# Patient Record
Sex: Male | Born: 1970
Health system: Southern US, Community
[De-identification: ages and names within clinical notes are randomized; demographics above are authoritative.]

## PROBLEM LIST (undated history)

## (undated) DIAGNOSIS — I10 Essential (primary) hypertension: Secondary | ICD-10-CM

## (undated) DIAGNOSIS — K219 Gastro-esophageal reflux disease without esophagitis: Secondary | ICD-10-CM

## (undated) DIAGNOSIS — G4730 Sleep apnea, unspecified: Secondary | ICD-10-CM

## (undated) HISTORY — DX: Essential (primary) hypertension: I10

## (undated) HISTORY — PX: HAND SURGERY: SHX662

---

## 1990-01-01 HISTORY — PX: HAND SURGERY: SHX662

## 1998-01-01 HISTORY — PX: COLONOSCOPY: SHX174

## 2007-12-17 ENCOUNTER — Emergency Department (HOSPITAL_COMMUNITY): Admission: EM | Admit: 2007-12-17 | Discharge: 2007-12-17 | Payer: Self-pay | Admitting: Family Medicine

## 2013-07-01 DIAGNOSIS — E291 Testicular hypofunction: Secondary | ICD-10-CM | POA: Insufficient documentation

## 2013-07-02 DIAGNOSIS — L821 Other seborrheic keratosis: Secondary | ICD-10-CM | POA: Insufficient documentation

## 2014-09-14 DIAGNOSIS — E66811 Obesity, class 1: Secondary | ICD-10-CM | POA: Insufficient documentation

## 2014-09-14 DIAGNOSIS — Z23 Encounter for immunization: Secondary | ICD-10-CM | POA: Insufficient documentation

## 2015-07-28 ENCOUNTER — Ambulatory Visit (INDEPENDENT_AMBULATORY_CARE_PROVIDER_SITE_OTHER): Payer: BLUE CROSS/BLUE SHIELD | Admitting: Podiatry

## 2015-07-28 ENCOUNTER — Encounter: Payer: Self-pay | Admitting: Podiatry

## 2015-07-28 DIAGNOSIS — M216X9 Other acquired deformities of unspecified foot: Secondary | ICD-10-CM | POA: Insufficient documentation

## 2015-07-28 DIAGNOSIS — M722 Plantar fascial fibromatosis: Secondary | ICD-10-CM

## 2015-07-28 NOTE — Patient Instructions (Signed)
Seen for bilateral heel pain. Reviewed findings. Need daily stretch exercise for tight Achilles tendon. May benefit from Custom orthotics. Night Splint dispensed x 1.

## 2015-07-28 NOTE — Progress Notes (Signed)
Heel pain 3-4 months especially in the morning L>R Just plantar anterior to the heel, espeially after exercise.  On feet at work about 11-12 hours/day off and on. Work 10 hour shift.  Nexium, SUBJECTIVE: 45 y.o. year old male presents complaining of bilateral heel pain x 3 -4 months especially in the morning and after getting up from sitting. Also hurts after exercise. He works 10 hour shift and on feet 11-12 hours a day.   REVIEW OF SYSTEMS: A comprehensive review of systems was negative except for: taking Nexium for heart burn.  OBJECTIVE: DERMATOLOGIC EXAMINATION: No abnormal skin lesions.   VASCULAR EXAMINATION OF LOWER LIMBS: Pedal pulses: All pedal pulses are palpable with normal pulsation.  Temperature gradient from tibial crest to dorsum of foot is within normal bilateral.  NEUROLOGIC EXAMINATION OF THE LOWER LIMBS: All epicritic and tactile sensations grossly intact.   MUSCULOSKELETAL EXAMINATION: Positive for hight arched cavus type foot, rearfoot varus, and tight Achilles tendon bilateral.   ASSESSMENT: 1. Plantar fasciitis bilateral. 2. Rearfoot varus, compensated. 3. Ankle equinus bilateral.  PLAN: Reviewed clinical findings and available treatment options. Reviewed stretch exercise for tight Achilles tendon bilateral. Night Splint dispensed x 1. Return for custom orthotics.

## 2015-08-11 ENCOUNTER — Encounter: Payer: Self-pay | Admitting: Podiatry

## 2015-08-11 ENCOUNTER — Ambulatory Visit (INDEPENDENT_AMBULATORY_CARE_PROVIDER_SITE_OTHER): Payer: BLUE CROSS/BLUE SHIELD | Admitting: Podiatry

## 2015-08-11 VITALS — BP 140/93 | HR 76

## 2015-08-11 DIAGNOSIS — M216X9 Other acquired deformities of unspecified foot: Secondary | ICD-10-CM | POA: Diagnosis not present

## 2015-08-11 DIAGNOSIS — M216X2 Other acquired deformities of left foot: Secondary | ICD-10-CM

## 2015-08-11 DIAGNOSIS — M722 Plantar fascial fibromatosis: Secondary | ICD-10-CM | POA: Diagnosis not present

## 2015-08-11 DIAGNOSIS — M216X1 Other acquired deformities of right foot: Secondary | ICD-10-CM

## 2015-08-11 DIAGNOSIS — M79673 Pain in unspecified foot: Secondary | ICD-10-CM | POA: Diagnosis not present

## 2015-08-11 NOTE — Patient Instructions (Signed)
Both feet casted for orthotics. 

## 2015-08-11 NOTE — Progress Notes (Signed)
SUBJECTIVE: 45 y.o. year old male presents to prepare for custom orthotics. He is using Night splint and doing stretch exercise. He believes they are helping.   History: Bilateral heel pain x 3 -4 months especially in the morning and after getting up from sitting.  Also hurts after exercise. He works 10 hour shift in [place] and on feet 11-12 hours a day.   REVIEW OF SYSTEMS: A comprehensive review of systems was negative except for: taking Nexium for heart burn.  OBJECTIVE: DERMATOLOGIC EXAMINATION: No abnormal skin lesions.  VASCULAR EXAMINATION OF LOWER LIMBS: Pedal pulses: All pedal pulses are palpable with normal pulsation.  Temperature gradient from tibial crest to dorsum of foot is within normal bilateral. NEUROLOGIC EXAMINATION OF THE LOWER LIMBS:  All epicritic and tactile sensations grossly intact.  MUSCULOSKELETAL EXAMINATION: Positive for hight arched cavus type foot, rearfoot varus, and tight Achilles tendon bilateral.   Radiographic examination reveal rectus foot, fibular sesamoid position at 4 bilateral, mild hallux interphalangeus bilateral. Lateral view reveal supinated foot, high arched cavus foot with elevated first ray and mild inferior calcaneal spur bilateral.   ASSESSMENT: 1. Plantar fasciitis bilateral. 2. Rearfoot varus, compensated. 3. Ankle equinus bilateral.  PLAN: Both feet casted for Orthotics.

## 2015-11-15 ENCOUNTER — Ambulatory Visit: Payer: BLUE CROSS/BLUE SHIELD | Admitting: Podiatry

## 2015-12-07 DIAGNOSIS — R5383 Other fatigue: Secondary | ICD-10-CM | POA: Insufficient documentation

## 2015-12-07 DIAGNOSIS — M25562 Pain in left knee: Secondary | ICD-10-CM | POA: Insufficient documentation

## 2015-12-07 DIAGNOSIS — E291 Testicular hypofunction: Secondary | ICD-10-CM | POA: Diagnosis not present

## 2015-12-07 DIAGNOSIS — G8929 Other chronic pain: Secondary | ICD-10-CM | POA: Diagnosis not present

## 2015-12-07 DIAGNOSIS — G4709 Other insomnia: Secondary | ICD-10-CM | POA: Diagnosis not present

## 2016-08-20 DIAGNOSIS — E291 Testicular hypofunction: Secondary | ICD-10-CM | POA: Diagnosis not present

## 2016-08-20 DIAGNOSIS — I10 Essential (primary) hypertension: Secondary | ICD-10-CM | POA: Diagnosis not present

## 2016-08-20 DIAGNOSIS — Z6833 Body mass index (BMI) 33.0-33.9, adult: Secondary | ICD-10-CM | POA: Diagnosis not present

## 2016-12-31 DIAGNOSIS — I10 Essential (primary) hypertension: Secondary | ICD-10-CM | POA: Diagnosis not present

## 2016-12-31 DIAGNOSIS — R0683 Snoring: Secondary | ICD-10-CM | POA: Diagnosis not present

## 2016-12-31 DIAGNOSIS — E669 Obesity, unspecified: Secondary | ICD-10-CM | POA: Diagnosis not present

## 2016-12-31 DIAGNOSIS — J0110 Acute frontal sinusitis, unspecified: Secondary | ICD-10-CM | POA: Diagnosis not present

## 2017-01-03 DIAGNOSIS — R0683 Snoring: Secondary | ICD-10-CM | POA: Diagnosis not present

## 2017-01-03 DIAGNOSIS — R5383 Other fatigue: Secondary | ICD-10-CM | POA: Diagnosis not present

## 2017-01-03 DIAGNOSIS — I10 Essential (primary) hypertension: Secondary | ICD-10-CM | POA: Diagnosis not present

## 2017-02-26 DIAGNOSIS — R29818 Other symptoms and signs involving the nervous system: Secondary | ICD-10-CM | POA: Insufficient documentation

## 2017-02-26 DIAGNOSIS — R0683 Snoring: Secondary | ICD-10-CM | POA: Diagnosis not present

## 2017-03-27 DIAGNOSIS — R400 Somnolence: Secondary | ICD-10-CM | POA: Diagnosis not present

## 2017-03-27 DIAGNOSIS — R0683 Snoring: Secondary | ICD-10-CM | POA: Diagnosis not present

## 2017-09-05 ENCOUNTER — Encounter: Payer: Self-pay | Admitting: Family Medicine

## 2017-09-05 ENCOUNTER — Ambulatory Visit: Payer: BLUE CROSS/BLUE SHIELD | Admitting: Family Medicine

## 2017-09-05 VITALS — BP 130/80 | HR 66 | Ht 72.0 in | Wt 236.4 lb

## 2017-09-05 DIAGNOSIS — Z7689 Persons encountering health services in other specified circumstances: Secondary | ICD-10-CM | POA: Insufficient documentation

## 2017-09-05 DIAGNOSIS — G4700 Insomnia, unspecified: Secondary | ICD-10-CM

## 2017-09-05 DIAGNOSIS — Z0000 Encounter for general adult medical examination without abnormal findings: Secondary | ICD-10-CM | POA: Insufficient documentation

## 2017-09-05 DIAGNOSIS — N529 Male erectile dysfunction, unspecified: Secondary | ICD-10-CM | POA: Diagnosis not present

## 2017-09-05 DIAGNOSIS — Z0001 Encounter for general adult medical examination with abnormal findings: Secondary | ICD-10-CM

## 2017-09-05 DIAGNOSIS — Z125 Encounter for screening for malignant neoplasm of prostate: Secondary | ICD-10-CM | POA: Insufficient documentation

## 2017-09-05 DIAGNOSIS — I10 Essential (primary) hypertension: Secondary | ICD-10-CM | POA: Diagnosis not present

## 2017-09-05 DIAGNOSIS — F1021 Alcohol dependence, in remission: Secondary | ICD-10-CM

## 2017-09-05 HISTORY — DX: Insomnia, unspecified: G47.00

## 2017-09-05 MED ORDER — LOSARTAN POTASSIUM-HCTZ 100-25 MG PO TABS: 1.0000 | ORAL_TABLET | Freq: Every day | ORAL | 1 refills | Status: DC

## 2017-09-05 MED ORDER — ZOLPIDEM TARTRATE 5 MG PO TABS: 5.0000 mg | ORAL_TABLET | Freq: Every evening | ORAL | 1 refills | Status: DC | PRN

## 2017-09-05 MED ORDER — TADALAFIL 20 MG PO TABS: 20.0000 mg | ORAL_TABLET | Freq: Every day | ORAL | 3 refills | Status: DC | PRN

## 2017-09-05 NOTE — Progress Notes (Signed)
Subjective:  Patient ID: Derek Davies, male    DOB: 04-19-1970  Age: 47 y.o. MRN: [id number]  CC: Establish Care   HPI [name] presents for establishment of care physical exam and follow-up of his hypertension erectile dysfunction and insomnia.  Blood pressure has been well controlled with the Hyzaar.  He has no issues taking the medicine is having no side effects.  He has a history of ED that has responded to Cialis 20 mg as needed.  Medicine is effective for him and he is having no side effects from it.  He has a history of intermittent insomnia.  He has used Ambien in the past and it has been effective.  He quarters that Ambien and might take it maybe once or twice weekly.  Is reported no sleep aberration with it.  He denies depression.  He quit smoking 15 years ago.  He does not use illicit drugs.  He drinks alcohol on occasion and rarely consumes more than 1 or 2 servings in a given setting.  He lives with his wife.  He has 1 child and is currently living with him.  There is another child who is in college and lives at the home intermittently.  Patient works for [employer].  His parents are in their late 49s.  Mom is treated for hypertension.  Dad is healthy as far as he knows.  Patient has a history of snoring.  He is status post sleep study 2 months ago that was negative.  Patient received yearly physicals for DOT and has never had trouble passing the exam.  History Braden has no past medical history on file.   He has no past surgical history on file.   His family history is not on file.He reports that he has quit smoking. He has never used smokeless tobacco. His alcohol and drug histories are not on file.  Outpatient Medications Prior to Visit  Medication Sig Dispense Refill  . losartan-hydrochlorothiazide (HYZAAR) 100-25 MG tablet Take 1 tablet by mouth daily.    . tadalafil (ADCIRCA/CIALIS) 20 MG tablet Take 1 tablet by mouth as needed.    . Zolpidem Tartrate (AMBIEN PO)  Take 1 tablet by mouth at bedtime as needed.     No facility-administered medications prior to visit.     ROS Review of Systems  Constitutional: Negative for chills, diaphoresis, fatigue and unexpected weight change.  HENT: Negative.   Eyes: Negative.   Respiratory: Negative.   Cardiovascular: Negative.   Gastrointestinal: Negative.   Endocrine: Negative for polyphagia.  Genitourinary: Negative.   Musculoskeletal: Negative for gait problem and joint swelling.  Skin: Negative for pallor.  Allergic/Immunologic: Negative for immunocompromised state.  Neurological: Negative for weakness and headaches.  Hematological: Does not bruise/bleed easily.  Psychiatric/Behavioral: Positive for sleep disturbance. Negative for dysphoric mood. The patient is not nervous/anxious.     Objective:  BP 130/80   Pulse 66   Ht 6' (1.829 m)   Wt 236 lb 6 oz (107.2 kg)   SpO2 97%   BMI 32.06 kg/m   Physical Exam  Constitutional: He is oriented to person, place, and time. He appears well-developed and well-nourished. No distress.  HENT:  Head: Normocephalic and atraumatic.  Right Ear: External ear normal.  Left Ear: External ear normal.  Mouth/Throat: Oropharynx is clear and moist.    Eyes: Pupils are equal, round, and reactive to light. Conjunctivae and EOM are normal. Right eye exhibits no discharge. Left eye exhibits no discharge. No scleral  icterus.  Neck: Neck supple. No JVD present. No tracheal deviation present. No thyromegaly present.  Cardiovascular: Normal rate, regular rhythm and normal heart sounds.  Pulmonary/Chest: Effort normal and breath sounds normal.  Abdominal: Bowel sounds are normal.  Lymphadenopathy:    He has no cervical adenopathy.  Neurological: He is alert and oriented to person, place, and time.  Skin: Skin is warm and dry. He is not diaphoretic.  Psychiatric: He has a normal mood and affect. His behavior is normal.      Assessment & Plan:   Edman was seen  today for establish care.  Diagnoses and all orders for this visit:  Essential hypertension -     losartan-hydrochlorothiazide (HYZAAR) 100-25 MG tablet; Take 1 tablet by mouth daily. -     CBC; Future -     Comprehensive metabolic panel; Future -     Urinalysis, Routine w reflex microscopic; Future  Erectile dysfunction, unspecified erectile dysfunction type -     tadalafil (ADCIRCA/CIALIS) 20 MG tablet; Take 1 tablet (20 mg total) by mouth daily as needed.  Insomnia, unspecified type -     zolpidem (AMBIEN) 5 MG tablet; Take 1 tablet (5 mg total) by mouth at bedtime as needed.  Encounter for health maintenance examination with abnormal findings -     CBC; Future -     Comprehensive metabolic panel; Future -     Lipid panel; Future -     Urinalysis, Routine w reflex microscopic; Future   I have changed Berman Alexopoulos's Zolpidem Tartrate (AMBIEN PO) to zolpidem (AMBIEN) 5 MG tablet. I have also changed his tadalafil. I am also having him maintain his losartan-hydrochlorothiazide.  Meds ordered this encounter  Medications  . tadalafil (ADCIRCA/CIALIS) 20 MG tablet    Sig: Take 1 tablet (20 mg total) by mouth daily as needed.    Dispense:  10 tablet    Refill:  3  . zolpidem (AMBIEN) 5 MG tablet    Sig: Take 1 tablet (5 mg total) by mouth at bedtime as needed.    Dispense:  30 tablet    Refill:  1  . losartan-hydrochlorothiazide (HYZAAR) 100-25 MG tablet    Sig: Take 1 tablet by mouth daily.    Dispense:  100 tablet    Refill:  1   Patient was given anticipatory guidance for health maintenance and disease prevention.  He will return fasting for above ordered blood work.  He will take the Hyzaar daily use tadalafil as needed.  He will use Ambien as needed sparingly.  Follow-up: Return in about 6 months (around 03/06/2018).  Libby Maw, MD

## 2017-09-05 NOTE — Patient Instructions (Signed)

## 2017-09-06 DIAGNOSIS — F1021 Alcohol dependence, in remission: Secondary | ICD-10-CM | POA: Insufficient documentation

## 2017-09-11 ENCOUNTER — Other Ambulatory Visit (INDEPENDENT_AMBULATORY_CARE_PROVIDER_SITE_OTHER): Payer: BLUE CROSS/BLUE SHIELD

## 2017-09-11 DIAGNOSIS — I10 Essential (primary) hypertension: Secondary | ICD-10-CM | POA: Diagnosis not present

## 2017-09-11 DIAGNOSIS — Z0001 Encounter for general adult medical examination with abnormal findings: Secondary | ICD-10-CM

## 2017-09-11 LAB — URINALYSIS, ROUTINE W REFLEX MICROSCOPIC
Bilirubin Urine: NEGATIVE
Ketones, ur: NEGATIVE
LEUKOCYTES UA: NEGATIVE
Nitrite: NEGATIVE
SPECIFIC GRAVITY, URINE: 1.015 (ref 1.000–1.030)
Total Protein, Urine: NEGATIVE
Urine Glucose: NEGATIVE
Urobilinogen, UA: 0.2 (ref 0.0–1.0)
pH: 7 (ref 5.0–8.0)

## 2017-09-11 LAB — COMPREHENSIVE METABOLIC PANEL
ALBUMIN: 4.4 g/dL (ref 3.5–5.2)
ALT: 27 U/L (ref 0–53)
AST: 20 U/L (ref 0–37)
Alkaline Phosphatase: 52 U/L (ref 39–117)
BILIRUBIN TOTAL: 0.7 mg/dL (ref 0.2–1.2)
BUN: 13 mg/dL (ref 6–23)
CALCIUM: 9.5 mg/dL (ref 8.4–10.5)
CHLORIDE: 104 meq/L (ref 96–112)
CO2: 31 mEq/L (ref 19–32)
Creatinine, Ser: 1.04 mg/dL (ref 0.40–1.50)
GFR: 81.05 mL/min (ref >60.00)
Glucose, Bld: 107 mg/dL — ABNORMAL HIGH (ref 70–99)
Potassium: 3.8 mEq/L (ref 3.5–5.1)
SODIUM: 141 meq/L (ref 135–145)
Total Protein: 7.3 g/dL (ref 6.0–8.3)

## 2017-09-11 LAB — LIPID PANEL
CHOLESTEROL: 170 mg/dL (ref 0–200)
HDL: 44.6 mg/dL (ref >39.00)
LDL CALC: 100 mg/dL — AB (ref 0–99)
NonHDL: 125.69
TRIGLYCERIDES: 126 mg/dL (ref 0.0–149.0)
Total CHOL/HDL Ratio: 4
VLDL: 25.2 mg/dL (ref 0.0–40.0)

## 2017-09-11 LAB — CBC
HEMATOCRIT: 43.4 % (ref 39.0–52.0)
HEMOGLOBIN: 15 g/dL (ref 13.0–17.0)
MCHC: 34.5 g/dL (ref 30.0–36.0)
MCV: 91.7 fl (ref 78.0–100.0)
Platelets: 228 10*3/uL (ref 150.0–400.0)
RBC: 4.73 Mil/uL (ref 4.22–5.81)
RDW: 13 % (ref 11.5–15.5)
WBC: 5.2 10*3/uL (ref 4.0–10.5)

## 2017-09-12 ENCOUNTER — Telehealth: Payer: Self-pay

## 2017-09-12 DIAGNOSIS — Z0001 Encounter for general adult medical examination with abnormal findings: Secondary | ICD-10-CM

## 2017-09-12 NOTE — Telephone Encounter (Signed)
Spoke with pt and created future order for UA

## 2017-12-20 ENCOUNTER — Other Ambulatory Visit: Payer: Self-pay

## 2017-12-20 MED ORDER — LOSARTAN POTASSIUM 100 MG PO TABS: 100.0000 mg | ORAL_TABLET | Freq: Every day | ORAL | 1 refills | Status: DC

## 2017-12-20 MED ORDER — HYDROCHLOROTHIAZIDE 25 MG PO TABS: 25.0000 mg | ORAL_TABLET | Freq: Every day | ORAL | 1 refills | Status: DC

## 2018-03-24 ENCOUNTER — Other Ambulatory Visit: Payer: Self-pay | Admitting: Family Medicine

## 2018-03-24 DIAGNOSIS — G4700 Insomnia, unspecified: Secondary | ICD-10-CM

## 2018-04-01 ENCOUNTER — Telehealth: Payer: Self-pay | Admitting: Family Medicine

## 2018-04-01 DIAGNOSIS — G4700 Insomnia, unspecified: Secondary | ICD-10-CM

## 2018-04-01 MED ORDER — ZOLPIDEM TARTRATE 5 MG PO TABS: ORAL_TABLET | ORAL | 0 refills | Status: DC

## 2018-04-01 NOTE — Telephone Encounter (Signed)
Have refilled Ambien. Patient is due for fu of bp. Needs webex.

## 2018-04-01 NOTE — Telephone Encounter (Signed)
Okay to refill? Last filled 09/06/17

## 2018-04-01 NOTE — Telephone Encounter (Signed)
Copied from [place] [id number]. Topic: Quick Communication - Rx Refill/Question >> Apr 01, 2018  3:00 PM Dimple Nanas, RN wrote: Medication: Ambien 5 mg  Has the patient contacted their pharmacy? Yes.  Pt states he was told to contact the office (Agent: If no, request that the patient contact the pharmacy for the refill.) (Agent: If yes, when and what did the pharmacy advise?)  Preferred Pharmacy (with phone number or street name):   [facility] [place], [place]  Agent: Please be advised that RX refills may take up to 3 business days. We ask that you follow-up with your pharmacy.

## 2018-04-02 NOTE — Telephone Encounter (Signed)
I called and spoke with patient. Webex scheduled for tomorrow at 10:00am. Email verified.

## 2018-04-03 ENCOUNTER — Ambulatory Visit (INDEPENDENT_AMBULATORY_CARE_PROVIDER_SITE_OTHER): Payer: BLUE CROSS/BLUE SHIELD | Admitting: Family Medicine

## 2018-04-03 ENCOUNTER — Encounter: Payer: Self-pay | Admitting: Family Medicine

## 2018-04-03 VITALS — Ht 72.0 in

## 2018-04-03 DIAGNOSIS — I10 Essential (primary) hypertension: Secondary | ICD-10-CM | POA: Diagnosis not present

## 2018-04-03 MED ORDER — LOSARTAN POTASSIUM-HCTZ 100-25 MG PO TABS: 1.0000 | ORAL_TABLET | Freq: Every day | ORAL | 1 refills | Status: DC

## 2018-04-03 NOTE — Progress Notes (Addendum)
Virtual Visit via Video Note  I connected with Diannia Ruder on 04/03/18 at 10:00 AM EDT by a video enabled telemedicine application and verified that I am speaking with the correct person using two identifiers.   I discussed the limitations of evaluation and management by telemedicine and the availability of in person appointments. The patient expressed understanding and agreed to proceed.  Interactive video and audio telecommunications were attempted between myself and the patient. However they failed due to the patient having technical difficulties or not having access to video capability. We continued and completed with audio only.  History of Present Illness:   Established Patient Office Visit  Subjective:  Patient ID: Derek Davies, male    DOB: 18-Nov-1970  Age: 48 y.o. MRN: [id number]  CC:  Chief Complaint  Patient presents with  . Follow-up    HPI Derek Davies presents for fu of his htn. Bp has been running in the 120-130 over 70s range. No issue with the med. Wife has been checking. No issues passion dot pe. Using ambien sparingly. Experiencing some fatigue. Has been trying to exercise more with walking. History reviewed. No pertinent past medical history.  History reviewed. No pertinent surgical history.  History reviewed. No pertinent family history.  Social History   Socioeconomic History  . Marital status: Married    Spouse name: Not on file  . Number of children: Not on file  . Years of education: Not on file  . Highest education level: Not on file  Occupational History  . Not on file  Social Needs  . Financial resource strain: Not on file  . Food insecurity:    Worry: Not on file    Inability: Not on file  . Transportation needs:    Medical: Not on file    Non-medical: Not on file  Tobacco Use  . Smoking status: Former Research scientist (life sciences)  . Smokeless tobacco: Never Used  Substance and Sexual Activity  . Alcohol use: Not on file  . Drug use: Not on file   . Sexual activity: Not on file  Lifestyle  . Physical activity:    Days per week: Not on file    Minutes per session: Not on file  . Stress: Not on file  Relationships  . Social connections:    Talks on phone: Not on file    Gets together: Not on file    Attends religious service: Not on file    Active member of club or organization: Not on file    Attends meetings of clubs or organizations: Not on file    Relationship status: Not on file  . Intimate partner violence:    Fear of current or ex partner: Not on file    Emotionally abused: Not on file    Physically abused: Not on file    Forced sexual activity: Not on file  Other Topics Concern  . Not on file  Social History Narrative  . Not on file    Outpatient Medications Prior to Visit  Medication Sig Dispense Refill  . hydrochlorothiazide (HYDRODIURIL) 25 MG tablet Take 1 tablet (25 mg total) by mouth daily. 90 tablet 1  . losartan (COZAAR) 100 MG tablet Take 1 tablet (100 mg total) by mouth daily. 90 tablet 1  . tadalafil (ADCIRCA/CIALIS) 20 MG tablet Take 1 tablet (20 mg total) by mouth daily as needed. 10 tablet 3  . zolpidem (AMBIEN) 5 MG tablet TAKE 1 TABLET BY MOUTH ONCE DAILY AT BEDTIME AS NEEDED 30 tablet  0   No facility-administered medications prior to visit.     No Known Allergies  ROS Review of Systems  Constitutional: Positive for fatigue. Negative for diaphoresis and fever.  HENT: Negative.   Respiratory: Negative.   Cardiovascular: Negative.   Gastrointestinal: Negative.   Neurological: Negative.   Hematological: Negative.   Psychiatric/Behavioral: Negative.       Objective:    Physical Exam  Constitutional: He is oriented to person, place, and time. No distress.  Pulmonary/Chest: Effort normal.  Neurological: He is alert and oriented to person, place, and time.  Psychiatric: He has a normal mood and affect. His behavior is normal.    Ht 6' (1.829 m)   BMI 32.06 kg/m  Wt Readings from  Last 3 Encounters:  09/05/17 236 lb 6 oz (107.2 kg)     Health Maintenance Due  Topic Date Due  . HIV Screening  10/14/1984  . TETANUS/TDAP  10/14/1988    There are no preventive care reminders to display for this patient.  No results found for: TSH Lab Results  Component Value Date   WBC 5.2 09/11/2017   HGB 15.0 09/11/2017   HCT 43.4 09/11/2017   MCV 91.7 09/11/2017   PLT 228.0 09/11/2017   Lab Results  Component Value Date   NA 141 09/11/2017   K 3.8 09/11/2017   CO2 31 09/11/2017   GLUCOSE 107 (H) 09/11/2017   BUN 13 09/11/2017   CREATININE 1.04 09/11/2017   BILITOT 0.7 09/11/2017   ALKPHOS 52 09/11/2017   AST 20 09/11/2017   ALT 27 09/11/2017   PROT 7.3 09/11/2017   ALBUMIN 4.4 09/11/2017   CALCIUM 9.5 09/11/2017   GFR 81.05 09/11/2017   Lab Results  Component Value Date   CHOL 170 09/11/2017   Lab Results  Component Value Date   HDL 44.60 09/11/2017   Lab Results  Component Value Date   LDLCALC 100 (H) 09/11/2017   Lab Results  Component Value Date   TRIG 126.0 09/11/2017   Lab Results  Component Value Date   CHOLHDL 4 09/11/2017   No results found for: HGBA1C    Assessment & Plan:   Problem List Items Addressed This Visit      Cardiovascular and Mediastinum   Essential hypertension - Primary   Relevant Medications   losartan-hydrochlorothiazide (HYZAAR) 100-25 MG tablet   Other Relevant Orders   Basic metabolic panel      Meds ordered this encounter  Medications  . losartan-hydrochlorothiazide (HYZAAR) 100-25 MG tablet    Sig: Take 1 tablet by mouth daily.    Dispense:  90 tablet    Refill:  1    Follow-up: Return in about 6 months (around 10/03/2018), or if symptoms worsen or fail to improve.    Libby Maw, MD    Observations/Objective:   Assessment and Plan:   Follow Up Instructions:    I discussed the assessment and treatment plan with the patient. The patient was provided an opportunity to ask  questions and all were answered. The patient agreed with the plan and demonstrated an understanding of the instructions.   The patient was advised to call back or seek an in-person evaluation if the symptoms worsen or if the condition fails to improve as anticipated.  I provided 12 minutes of non-face-to-face time during this encounter.  Patient will call and make a lab appointment. Will continue towalk for exercise. Fu in 6 mos or sooner if fatigue worsens.

## 2018-05-22 ENCOUNTER — Telehealth: Payer: Self-pay | Admitting: Family Medicine

## 2018-05-22 DIAGNOSIS — N529 Male erectile dysfunction, unspecified: Secondary | ICD-10-CM

## 2018-05-22 MED ORDER — TADALAFIL 20 MG PO TABS: 20.0000 mg | ORAL_TABLET | Freq: Every day | ORAL | 3 refills | Status: DC | PRN

## 2018-05-22 NOTE — Telephone Encounter (Signed)
Copied from [place] [id number]. Topic: Quick Communication - See Telephone Encounter >> May 22, 2018  9:07 AM Ivar Drape wrote: CRM for notification. See Telephone encounter for: 05/22/18. Patient would like a refill on his tadalafil (ADCIRCA/CIALIS) 20 MG tablet medication and have it sent to his preferred [place] off of [address].

## 2018-05-22 NOTE — Telephone Encounter (Signed)
Rx sent in as requested. 

## 2018-11-19 ENCOUNTER — Telehealth: Payer: Self-pay

## 2018-11-19 NOTE — Telephone Encounter (Signed)

## 2018-11-20 ENCOUNTER — Other Ambulatory Visit: Payer: Self-pay

## 2018-11-20 ENCOUNTER — Encounter: Payer: Self-pay | Admitting: Family Medicine

## 2018-11-20 ENCOUNTER — Ambulatory Visit: Payer: BC Managed Care – PPO | Admitting: Family Medicine

## 2018-11-20 VITALS — BP 118/80 | HR 75 | Ht 72.0 in | Wt 242.0 lb

## 2018-11-20 DIAGNOSIS — N529 Male erectile dysfunction, unspecified: Secondary | ICD-10-CM

## 2018-11-20 DIAGNOSIS — R6882 Decreased libido: Secondary | ICD-10-CM | POA: Insufficient documentation

## 2018-11-20 DIAGNOSIS — R0683 Snoring: Secondary | ICD-10-CM | POA: Diagnosis not present

## 2018-11-20 DIAGNOSIS — I10 Essential (primary) hypertension: Secondary | ICD-10-CM | POA: Diagnosis not present

## 2018-11-20 DIAGNOSIS — R0600 Dyspnea, unspecified: Secondary | ICD-10-CM | POA: Insufficient documentation

## 2018-11-20 DIAGNOSIS — Z23 Encounter for immunization: Secondary | ICD-10-CM

## 2018-11-20 DIAGNOSIS — R0982 Postnasal drip: Secondary | ICD-10-CM

## 2018-11-20 MED ORDER — PREDNISONE 20 MG PO TABS: 20.0000 mg | ORAL_TABLET | Freq: Two times a day (BID) | ORAL | 0 refills | Status: AC

## 2018-11-20 MED ORDER — SILDENAFIL CITRATE 20 MG PO TABS: ORAL_TABLET | ORAL | 1 refills | Status: DC

## 2018-11-20 MED ORDER — FLUTICASONE PROPIONATE 50 MCG/ACT NA SUSP: 2.0000 | Freq: Every day | NASAL | 6 refills | Status: DC

## 2018-11-20 MED ORDER — SALINE SPRAY 0.65 % NA SOLN: 2.0000 | NASAL | 12 refills | Status: AC | PRN

## 2018-11-20 NOTE — Progress Notes (Signed)
Established Patient Office Visit  Subjective:  Patient ID: Derek Davies, male    DOB: [birth year]  Age: 48 y.o. MRN: [id number]  CC:  Chief Complaint  Patient presents with  . Follow-up    discuss cpap    HPI Derek Davies presents for follow-up of his hypertension, ED and decreased libido, snoring and sinus problems.  Blood pressure has been well controlled with the Hyzaar.  He is having no issues taking it.  ED has become more of an issue for him.  20 mg of Cialis does not always work.  His libido has been plus minus.  Patient was taking the Hyzaar with HCTZ prior to onset of his ED issues.  He is stressed.  He snores and it is bothering his wife.  Home sleep study some years ago was inconclusive.  He has been having some problems with his sinuses.  Stuffy nose and postnasal drip sneezing.  Denies fever or facial pressure.  Bloody rhinorrhea at times.  Is worse when he goes outside and works in the yard.  History reviewed. No pertinent past medical history.  History reviewed. No pertinent surgical history.  History reviewed. No pertinent family history.  Social History   Socioeconomic History  . Marital status: Married    Spouse name: Not on file  . Number of children: Not on file  . Years of education: Not on file  . Highest education level: Not on file  Occupational History  . Not on file  Social Needs  . Financial resource strain: Not on file  . Food insecurity    Worry: Not on file    Inability: Not on file  . Transportation needs    Medical: Not on file    Non-medical: Not on file  Tobacco Use  . Smoking status: Former Research scientist (life sciences)  . Smokeless tobacco: Never Used  Substance and Sexual Activity  . Alcohol use: Not on file  . Drug use: Not on file  . Sexual activity: Not on file  Lifestyle  . Physical activity    Days per week: Not on file    Minutes per session: Not on file  . Stress: Not on file  Relationships  . Social Herbalist on phone: Not  on file    Gets together: Not on file    Attends religious service: Not on file    Active member of club or organization: Not on file    Attends meetings of clubs or organizations: Not on file    Relationship status: Not on file  . Intimate partner violence    Fear of current or ex partner: Not on file    Emotionally abused: Not on file    Physically abused: Not on file    Forced sexual activity: Not on file  Other Topics Concern  . Not on file  Social History Narrative  . Not on file    Outpatient Medications Prior to Visit  Medication Sig Dispense Refill  . losartan-hydrochlorothiazide (HYZAAR) 100-25 MG tablet Take 1 tablet by mouth daily. 90 tablet 1  . tadalafil (CIALIS) 20 MG tablet Take 1 tablet (20 mg total) by mouth daily as needed. 10 tablet 3  . zolpidem (AMBIEN) 5 MG tablet TAKE 1 TABLET BY MOUTH ONCE DAILY AT BEDTIME AS NEEDED 30 tablet 0  . hydrochlorothiazide (HYDRODIURIL) 25 MG tablet Take 1 tablet (25 mg total) by mouth daily. 90 tablet 1  . losartan (COZAAR) 100 MG tablet Take 1 tablet (100  mg total) by mouth daily. 90 tablet 1   No facility-administered medications prior to visit.     No Known Allergies  ROS Review of Systems  Constitutional: Negative for diaphoresis, fever and unexpected weight change.  HENT: Positive for congestion, postnasal drip and rhinorrhea. Negative for dental problem, sinus pressure and sinus pain.   Eyes: Negative for photophobia and visual disturbance.  Respiratory: Negative for cough and shortness of breath.   Cardiovascular: Negative.   Gastrointestinal: Negative.   Genitourinary: Negative.   Skin: Negative for pallor and rash.  Allergic/Immunologic: Negative for immunocompromised state.  Neurological: Negative for light-headedness and headaches.  Hematological: Does not bruise/bleed easily.  Psychiatric/Behavioral: Negative.       Objective:    Physical Exam  Constitutional: He is oriented to person, place, and time.  He appears well-developed and well-nourished. No distress.  HENT:  Head: Normocephalic and atraumatic.  Right Ear: External ear normal.  Left Ear: External ear normal.  Mouth/Throat: Oropharynx is clear and moist. No oropharyngeal exudate.  Eyes: Pupils are equal, round, and reactive to light. Conjunctivae are normal. Right eye exhibits no discharge. Left eye exhibits no discharge. No scleral icterus.  Neck: Neck supple. No JVD present. No tracheal deviation present. No thyromegaly present.  Cardiovascular: Normal rate, regular rhythm and normal heart sounds.  Pulmonary/Chest: Effort normal and breath sounds normal. No stridor.  Abdominal: Bowel sounds are normal.  Musculoskeletal:        General: No edema.  Lymphadenopathy:    He has no cervical adenopathy.  Neurological: He is alert and oriented to person, place, and time.  Skin: Skin is warm and dry. He is not diaphoretic.  Psychiatric: He has a normal mood and affect.    BP 118/80   Pulse 75   Ht 6' (1.829 m)   Wt 242 lb (109.8 kg)   SpO2 96%   BMI 32.82 kg/m  Wt Readings from Last 3 Encounters:  11/20/18 242 lb (109.8 kg)  09/05/17 236 lb 6 oz (107.2 kg)   BP Readings from Last 3 Encounters:  11/20/18 118/80  09/05/17 130/80  08/11/15 (!) 140/93   Guideline developer:  UpToDate (see UpToDate for funding source) Date Released: June 2014   Health Maintenance Due  Topic Date Due  . HIV Screening  10/14/1984  . TETANUS/TDAP  10/14/1988  . INFLUENZA VACCINE  08/02/2018    There are no preventive care reminders to display for this patient.  No results found for: TSH Lab Results  Component Value Date   WBC 5.2 09/11/2017   HGB 15.0 09/11/2017   HCT 43.4 09/11/2017   MCV 91.7 09/11/2017   PLT 228.0 09/11/2017   Lab Results  Component Value Date   NA 141 09/11/2017   K 3.8 09/11/2017   CO2 31 09/11/2017   GLUCOSE 107 (H) 09/11/2017   BUN 13 09/11/2017   CREATININE 1.04 09/11/2017   BILITOT 0.7 09/11/2017    ALKPHOS 52 09/11/2017   AST 20 09/11/2017   ALT 27 09/11/2017   PROT 7.3 09/11/2017   ALBUMIN 4.4 09/11/2017   CALCIUM 9.5 09/11/2017   GFR 81.05 09/11/2017   Lab Results  Component Value Date   CHOL 170 09/11/2017   Lab Results  Component Value Date   HDL 44.60 09/11/2017   Lab Results  Component Value Date   LDLCALC 100 (H) 09/11/2017   Lab Results  Component Value Date   TRIG 126.0 09/11/2017   Lab Results  Component Value Date  CHOLHDL 4 09/11/2017   No results found for: HGBA1C    Assessment & Plan:   Problem List Items Addressed This Visit      Cardiovascular and Mediastinum   Essential hypertension - Primary   Relevant Medications   sildenafil (REVATIO) 20 MG tablet   Other Relevant Orders   CBC   Comp Met (CMET)     Other   Erectile dysfunction   Relevant Medications   sildenafil (REVATIO) 20 MG tablet   Other Relevant Orders   Testosterone   Libido, decreased   Relevant Orders   Testosterone   Snores   Relevant Orders   Ambulatory referral to Pulmonology   Post-nasal drip   Relevant Medications   predniSONE (DELTASONE) 20 MG tablet   sodium chloride (OCEAN) 0.65 % SOLN nasal spray   fluticasone (FLONASE) 50 MCG/ACT nasal spray      Meds ordered this encounter  Medications  . predniSONE (DELTASONE) 20 MG tablet    Sig: Take 1 tablet (20 mg total) by mouth 2 (two) times daily with a meal for 7 days.    Dispense:  14 tablet    Refill:  0  . sodium chloride (OCEAN) 0.65 % SOLN nasal spray    Sig: Place 2 sprays into both nostrils as needed for congestion.    Dispense:  88 mL    Refill:  12  . fluticasone (FLONASE) 50 MCG/ACT nasal spray    Sig: Place 2 sprays into both nostrils daily.    Dispense:  16 g    Refill:  6  . sildenafil (REVATIO) 20 MG tablet    Sig: May take 3-5 tablets daily 45 minutes prior as needed.    Dispense:  50 tablet    Refill:  1    Follow-up: Return in about 3 months (around 02/20/2019), or if symptoms  worsen or fail to improve.  Patient was given information on ED and insomnia.  Advised to use the Ocean mist copiously for his sinuses.  Start Flonase and prednisone.  Discussed Revatio use.

## 2018-11-20 NOTE — Patient Instructions (Signed)
Erectile Dysfunction Erectile dysfunction (ED) is the inability to get or keep an erection in order to have sexual intercourse. Erectile dysfunction may include:  Inability to get an erection.  Lack of enough hardness of the erection to allow penetration.  Loss of the erection before sex is finished. What are the causes? This condition may be caused by:  Certain medicines, such as: ? Pain relievers. ? Antihistamines. ? Antidepressants. ? Blood pressure medicines. ? Water pills (diuretics). ? Ulcer medicines. ? Muscle relaxants. ? Drugs.  Excessive drinking.  Psychological causes, such as: ? Anxiety. ? Depression. ? Sadness. ? Exhaustion. ? Performance fear. ? Stress.  Physical causes, such as: ? Artery problems. This may include diabetes, smoking, liver disease, or atherosclerosis. ? High blood pressure. ? Hormonal problems, such as low testosterone. ? Obesity. ? Nerve problems. This may include back or pelvic injuries, diabetes mellitus, multiple sclerosis, or Parkinson disease. What are the signs or symptoms? Symptoms of this condition include:  Inability to get an erection.  Lack of enough hardness of the erection to allow penetration.  Loss of the erection before sex is finished.  Normal erections at some times, but with frequent unsatisfactory episodes.  Low sexual satisfaction in either partner due to erection problems.  A curved penis occurring with erection. The curve may cause pain or the penis may be too curved to allow for intercourse.  Never having nighttime erections. How is this diagnosed? This condition is often diagnosed by:  Performing a physical exam to find other diseases or specific problems with the penis.  Asking you detailed questions about the problem.  Performing blood tests to check for diabetes mellitus or to measure hormone levels.  Performing other tests to check for underlying health conditions.  Performing an ultrasound  exam to check for scarring.  Performing a test to check blood flow to the penis.  Doing a sleep study at home to measure nighttime erections. How is this treated? This condition may be treated by:  Medicine taken by mouth to help you achieve an erection (oral medicine).  Hormone replacement therapy to replace low testosterone levels.  Medicine that is injected into the penis. Your health care provider may instruct you how to give yourself these injections at home.  Vacuum pump. This is a pump with a ring on it. The pump and ring are placed on the penis and used to create pressure that helps the penis become erect.  Penile implant surgery. In this procedure, you may receive: ? An inflatable implant. This consists of cylinders, a pump, and a reservoir. The cylinders can be inflated with a fluid that helps to create an erection, and they can be deflated after intercourse. ? A semi-rigid implant. This consists of two silicone rubber rods. The rods provide some rigidity. They are also flexible, so the penis can both curve downward in its normal position and become straight for sexual intercourse.  Blood vessel surgery, to improve blood flow to the penis. During this procedure, a blood vessel from a different part of the body is placed into the penis to allow blood to flow around (bypass) damaged or blocked blood vessels.  Lifestyle changes, such as exercising more, losing weight, and quitting smoking. Follow these instructions at home: Medicines   Take over-the-counter and prescription medicines only as told by your health care provider. Do not increase the dosage without first discussing it with your health care provider.  If you are using self-injections, perform injections as directed by your   health care provider. Make sure to avoid any veins that are on the surface of the penis. After giving an injection, apply pressure to the injection site for 5 minutes. General instructions   Exercise regularly, as directed by your health care provider. Work with your health care provider to lose weight, if needed.  Do not use any products that contain nicotine or tobacco, such as cigarettes and e-cigarettes. If you need help quitting, ask your health care provider.  Before using a vacuum pump, read the instructions that come with the pump and discuss any questions with your health care provider.  Keep all follow-up visits as told by your health care provider. This is important. Contact a health care provider if:  You feel nauseous.  You vomit. Get help right away if:  You are taking oral or injectable medicines and you have an erection that lasts longer than 4 hours. If your health care provider is unavailable, go to the nearest emergency room for evaluation. An erection that lasts much longer than 4 hours can result in permanent damage to your penis.  You have severe pain in your groin or abdomen.  You develop redness or severe swelling of your penis.  You have redness spreading up into your groin or lower abdomen.  You are unable to urinate.  You experience chest pain or a rapid heart beat (palpitations) after taking oral medicines. Summary  Erectile dysfunction (ED) is the inability to get or keep an erection during sexual intercourse. This problem can usually be treated successfully.  This condition is diagnosed based on a physical exam, your symptoms, and tests to determine the cause. Treatment varies depending on the cause, and may include medicines, hormone therapy, surgery, or vacuum pump.  You may need follow-up visits to make sure that you are using your medicines or devices correctly.  Get help right away if you are taking or injecting medicines and you have an erection that lasts longer than 4 hours. This information is not intended to replace advice given to you by your health care provider. Make sure you discuss any questions you have with your health care  provider. Document Released: 12/16/1999 Document Revised: 11/30/2016 Document Reviewed: 01/04/2016 Elsevier Patient Education  2020 [place].  Insomnia Insomnia is a sleep disorder that makes it difficult to fall asleep or stay asleep. Insomnia can cause fatigue, low energy, difficulty concentrating, mood swings, and poor performance at work or school. There are three different ways to classify insomnia:  Difficulty falling asleep.  Difficulty staying asleep.  Waking up too early in the morning. Any type of insomnia can be long-term (chronic) or short-term (acute). Both are common. Short-term insomnia usually lasts for three months or less. Chronic insomnia occurs at least three times a week for longer than three months. What are the causes? Insomnia may be caused by another condition, situation, or substance, such as:  Anxiety.  Certain medicines.  Gastroesophageal reflux disease (GERD) or other gastrointestinal conditions.  Asthma or other breathing conditions.  Restless legs syndrome, sleep apnea, or other sleep disorders.  Chronic pain.  Menopause.  Stroke.  Abuse of alcohol, tobacco, or illegal drugs.  Mental health conditions, such as depression.  Caffeine.  Neurological disorders, such as Alzheimer's disease.  An overactive thyroid (hyperthyroidism). Sometimes, the cause of insomnia may not be known. What increases the risk? Risk factors for insomnia include:  Gender. Women are affected more often than men.  Age. Insomnia is more common as you get older.  Stress.  Lack of exercise.  Irregular work schedule or working night shifts.  Traveling between different time zones.  Certain medical and mental health conditions. What are the signs or symptoms? If you have insomnia, the main symptom is having trouble falling asleep or having trouble staying asleep. This may lead to other symptoms, such as:  Feeling fatigued or having low energy.  Feeling  nervous about going to sleep.  Not feeling rested in the morning.  Having trouble concentrating.  Feeling irritable, anxious, or depressed. How is this diagnosed? This condition may be diagnosed based on:  Your symptoms and medical history. Your health care provider may ask about: ? Your sleep habits. ? Any medical conditions you have. ? Your mental health.  A physical exam. How is this treated? Treatment for insomnia depends on the cause. Treatment may focus on treating an underlying condition that is causing insomnia. Treatment may also include:  Medicines to help you sleep.  Counseling or therapy.  Lifestyle adjustments to help you sleep better. Follow these instructions at home: Eating and drinking   Limit or avoid alcohol, caffeinated beverages, and cigarettes, especially close to bedtime. These can disrupt your sleep.  Do not eat a large meal or eat spicy foods right before bedtime. This can lead to digestive discomfort that can make it hard for you to sleep. Sleep habits   Keep a sleep diary to help you and your health care provider figure out what could be causing your insomnia. Write down: ? When you sleep. ? When you wake up during the night. ? How well you sleep. ? How rested you feel the next day. ? Any side effects of medicines you are taking. ? What you eat and drink.  Make your bedroom a dark, comfortable place where it is easy to fall asleep. ? Put up shades or blackout curtains to block light from outside. ? Use a white noise machine to block noise. ? Keep the temperature cool.  Limit screen use before bedtime. This includes: ? Watching TV. ? Using your smartphone, tablet, or computer.  Stick to a routine that includes going to bed and waking up at the same times every day and night. This can help you fall asleep faster. Consider making a quiet activity, such as reading, part of your nighttime routine.  Try to avoid taking naps during the day so  that you sleep better at night.  Get out of bed if you are still awake after 15 minutes of trying to sleep. Keep the lights down, but try reading or doing a quiet activity. When you feel sleepy, go back to bed. General instructions  Take over-the-counter and prescription medicines only as told by your health care provider.  Exercise regularly, as told by your health care provider. Avoid exercise starting several hours before bedtime.  Use relaxation techniques to manage stress. Ask your health care provider to suggest some techniques that may work well for you. These may include: ? Breathing exercises. ? Routines to release muscle tension. ? Visualizing peaceful scenes.  Make sure that you drive carefully. Avoid driving if you feel very sleepy.  Keep all follow-up visits as told by your health care provider. This is important. Contact a health care provider if:  You are tired throughout the day.  You have trouble in your daily routine due to sleepiness.  You continue to have sleep problems, or your sleep problems get worse. Get help right away if:  You have serious thoughts about  hurting yourself or someone else. If you ever feel like you may hurt yourself or others, or have thoughts about taking your own life, get help right away. You can go to your nearest emergency department or call:  Your local emergency services (911 in the [place]  A suicide crisis helpline, such as the [place] at (503) 858-2366. This is open 24 hours a day. Summary  Insomnia is a sleep disorder that makes it difficult to fall asleep or stay asleep.  Insomnia can be long-term (chronic) or short-term (acute).  Treatment for insomnia depends on the cause. Treatment may focus on treating an underlying condition that is causing insomnia.  Keep a sleep diary to help you and your health care provider figure out what could be causing your insomnia. This information is not intended  to replace advice given to you by your health care provider. Make sure you discuss any questions you have with your health care provider. Document Released: 12/16/1999 Document Revised: 11/30/2016 Document Reviewed: 09/27/2016 Elsevier Patient Education  2020 [employer].

## 2018-11-21 LAB — CBC
HCT: 45.8 % (ref 39.0–52.0)
Hemoglobin: 15.8 g/dL (ref 13.0–17.0)
MCHC: 34.5 g/dL (ref 30.0–36.0)
MCV: 93.9 fl (ref 78.0–100.0)
Platelets: 251 10*3/uL (ref 150.0–400.0)
RBC: 4.88 Mil/uL (ref 4.22–5.81)
RDW: 13 % (ref 11.5–15.5)
WBC: 7.4 10*3/uL (ref 4.0–10.5)

## 2018-11-21 LAB — COMPREHENSIVE METABOLIC PANEL
ALT: 32 U/L (ref 0–53)
AST: 21 U/L (ref 0–37)
Albumin: 4.6 g/dL (ref 3.5–5.2)
Alkaline Phosphatase: 56 U/L (ref 39–117)
BUN: 12 mg/dL (ref 6–23)
CO2: 32 mEq/L (ref 19–32)
Calcium: 9.8 mg/dL (ref 8.4–10.5)
Chloride: 102 mEq/L (ref 96–112)
Creatinine, Ser: 1.15 mg/dL (ref 0.40–1.50)
GFR: 67.56 mL/min (ref >60.00)
Glucose, Bld: 86 mg/dL (ref 70–99)
Potassium: 4 mEq/L (ref 3.5–5.1)
Sodium: 142 mEq/L (ref 135–145)
Total Bilirubin: 0.5 mg/dL (ref 0.2–1.2)
Total Protein: 7.2 g/dL (ref 6.0–8.3)

## 2018-11-21 LAB — TESTOSTERONE: Testosterone: 397.28 ng/dL (ref 300.00–890.00)

## 2018-12-15 ENCOUNTER — Telehealth: Payer: Self-pay

## 2018-12-15 NOTE — Telephone Encounter (Signed)
Patient returned call. Provided phone number for [facility] in [place] per message below.

## 2018-12-15 NOTE — Telephone Encounter (Signed)
Me and Brayton Layman looked at referral. At first the referral was sent to [facility] in [place] but due to [place] they are no longer seeing pt in that location. Monica re-sent the referral to the [place] location. Left vm to call back    Copied from [place] [phone]. Topic: General - Inquiry >> Dec 15, 2018 11:13 AM Virl Axe D wrote: Reason for CRM: Pt stated he spoke with [facility] and they stated they did not receive referral for sleep study and could not schedule pt. Please follow up with pt so me may get this scheduled.

## 2018-12-17 ENCOUNTER — Encounter: Payer: Self-pay | Admitting: Pulmonary Disease

## 2018-12-17 ENCOUNTER — Other Ambulatory Visit: Payer: Self-pay

## 2018-12-17 ENCOUNTER — Ambulatory Visit (INDEPENDENT_AMBULATORY_CARE_PROVIDER_SITE_OTHER): Payer: BC Managed Care – PPO | Admitting: Pulmonary Disease

## 2018-12-17 VITALS — BP 148/78 | HR 93 | Temp 97.9°F | Wt 250.0 lb

## 2018-12-17 DIAGNOSIS — R0683 Snoring: Secondary | ICD-10-CM | POA: Diagnosis not present

## 2018-12-17 NOTE — Progress Notes (Signed)
Subjective:    Patient ID: Derek Davies, male    DOB: 1970-05-30, 48 y.o.   MRN: [id number]  Patient is being seen for snoring  He had had a sleep study performed at [facility] on the last 6 months to a year Was told that the study was negative  Reports being told by spouse about snoring He wakes himself up sometimes feeling like his been snoring Denies gasping respirations Snores in all positions  Usually goes to bed between 9 30-10 Falls asleep in 5 minutes or less Wakes up multiple times during the night  Usually will wake up in the middle of the night and not able able to fall asleep for up to 1 hour  Final awakening time about 5:20 AM  About 30 pound weight gain in the last year  He has hypertension-well-controlled on medications Nose strips seem to help his snoring  Reformed smoker, quit about 10 years ago  No family history of diagnosed obstructive sleep apnea  Past Medical History:  Diagnosis Date  . Hypertension    Social History   Socioeconomic History  . Marital status: Married    Spouse name: Not on file  . Number of children: Not on file  . Years of education: Not on file  . Highest education level: Not on file  Occupational History  . Not on file  Tobacco Use  . Smoking status: Former Smoker    Packs/day: 1.00    Years: 3.00    Pack years: 3.00    Types: Cigarettes  . Smokeless tobacco: Never Used  Substance and Sexual Activity  . Alcohol use: Not on file  . Drug use: Not on file  . Sexual activity: Not on file  Other Topics Concern  . Not on file  Social History Narrative  . Not on file   Social Determinants of Health   Financial Resource Strain:   . Difficulty of Paying Living Expenses: Not on file  Food Insecurity:   . Worried About Charity fundraiser in the Last Year: Not on file  . Ran Out of Food in the Last Year: Not on file  Transportation Needs:   . Lack of Transportation (Medical): Not on file  . Lack of  Transportation (Non-Medical): Not on file  Physical Activity:   . Days of Exercise per Week: Not on file  . Minutes of Exercise per Session: Not on file  Stress:   . Feeling of Stress : Not on file  Social Connections:   . Frequency of Communication with Friends and Family: Not on file  . Frequency of Social Gatherings with Friends and Family: Not on file  . Attends Religious Services: Not on file  . Active Member of Clubs or Organizations: Not on file  . Attends Archivist Meetings: Not on file  . Marital Status: Not on file  Intimate Partner Violence:   . Fear of Current or Ex-Partner: Not on file  . Emotionally Abused: Not on file  . Physically Abused: Not on file  . Sexually Abused: Not on file   Family History  Problem Relation Age of Onset  . Heart disease Mother   . Throat cancer Maternal Aunt   . Throat cancer Maternal Uncle    Review of Systems  Constitutional: Positive for unexpected weight change. Negative for fever.  HENT: Positive for congestion. Negative for dental problem, ear pain, nosebleeds, postnasal drip, rhinorrhea, sinus pressure, sneezing, sore throat and trouble swallowing.   Eyes:  Negative for redness and itching.  Respiratory: Negative for cough, chest tightness, shortness of breath and wheezing.   Cardiovascular: Negative for palpitations and leg swelling.  Gastrointestinal: Negative for nausea and vomiting.  Genitourinary: Negative for dysuria.  Musculoskeletal: Negative for joint swelling.  Skin: Negative for rash.  Allergic/Immunologic: Negative.  Negative for environmental allergies, food allergies and immunocompromised state.  Neurological: Positive for headaches.  Hematological: Does not bruise/bleed easily.  Psychiatric/Behavioral: Negative for dysphoric mood. The patient is not nervous/anxious.       Objective:   Physical Exam Constitutional:      Appearance: Normal appearance.  HENT:     Head: Normocephalic.      Mouth/Throat:     Mouth: Mucous membranes are moist.     Pharynx: No oropharyngeal exudate.     Comments: Crowded oropharynx Eyes:     Extraocular Movements: Extraocular movements intact.     Pupils: Pupils are equal, round, and reactive to light.  Neck:     Comments: Neck is thick Cardiovascular:     Rate and Rhythm: Normal rate and regular rhythm.  Pulmonary:     Effort: Pulmonary effort is normal. No respiratory distress.     Breath sounds: Normal breath sounds. No stridor. No wheezing or rhonchi.  Musculoskeletal:        General: Normal range of motion.     Cervical back: Normal range of motion. No rigidity or tenderness.  Neurological:     General: No focal deficit present.     Mental Status: He is alert and oriented to person, place, and time.  Psychiatric:        Mood and Affect: Mood normal.        Behavior: Behavior normal.    Vitals:   12/17/18 1449  BP: (!) 148/78  Pulse: 93  Temp: 97.9 F (36.6 C)  SpO2: 95%   Results of the Epworth flowsheet 12/17/2018  Sitting and reading 1  Watching TV 1  Sitting, inactive in a public place (e.g. a theatre or a meeting) 0  As a passenger in a car for an hour without a break 0  Lying down to rest in the afternoon when circumstances permit 1  Sitting and talking to someone 0  Sitting quietly after a lunch without alcohol 0  In a car, while stopped for a few minutes in traffic 0  Total score 3   Look through care everywhere -Was evaluated at [facility] in March 2019    Assessment & Plan:  .  Snoring .  Daytime fatigue .  Obesity .  Controlled hypertension .  Insomnia-takes Ambien about once a week  .  He may just have primary snoring especially with his weight gain and a negative study recently  Plan: .  Repeat a home sleep study  .  Encouraged to continue to work on weight loss efforts  .  We will see him back in about 3 months .  Encouraged to call with any significant concerns

## 2018-12-17 NOTE — Patient Instructions (Signed)
Snoring Possible obstructive sleep apnea  We will repeat your home sleep study  Continue to work on weight loss  Elevation of the head of the bed may help snoring  Look for more information on UPPP-uvulopalatopharyngoplasty  Uvulopalatopharyngoplasty Uvulopalatopharyngoplasty is a surgery to remove or reshape soft tissues in the back of the throat, such as:  The tonsils.  The small piece of tissue that hangs in the back of the throat (uvula).  Soft tissue at the back of the roof of the mouth (soft palate). This surgery may be done to treat severe snoring or sleep apnea that is caused by throat blockage related to too much tissue. Tell a health care provider about:  Any allergies you have.  All medicines you are taking, including vitamins, herbs, eye drops, creams, and over-the-counter medicines.  Any problems you or family members have had with anesthetic medicines.  Any blood disorders you have.  Any surgeries you have had.  Any medical conditions you have.  Whether you are pregnant or may be pregnant. What are the risks? Generally, this is a safe procedure. However, problems may occur, including:  Infection.  Bleeding.  Allergic reactions to medicines.  Damage to other structures or organs.  Blood clots.  Changes in your voice or sense of smell.  Difficulty swallowing.  Having fluids come out of your nose when swallowing.  Return of apnea symptoms. What happens before the procedure? Medicines Ask your health care provider about:  Changing or stopping your regular medicines. This is especially important if you are taking diabetes medicines or blood thinners.  Taking medicines such as aspirin and ibuprofen. These medicines can thin your blood. Do not take these medicines unless your health care provider tells you to take them.  Taking over-the-counter medicines, vitamins, herbs, and supplements. Eating and drinking Follow instructions from your health  care provider about eating and drinking, which may include:  8 hours before the procedure - stop eating heavy meals or foods, such as meat, fried foods, or fatty foods.  6 hours before the procedure - stop eating light meals or foods, such as toast or cereal.  6 hours before the procedure - stop drinking milk or drinks that contain milk.  2 hours before the procedure - stop drinking clear liquids. Staying hydrated Follow instructions from your health care provider about hydration, which may include:  Up to 2 hours before the procedure - you may continue to drink clear liquids, such as water, clear fruit juice, black coffee, and plain tea. Exams and tests  You will have an exam to evaluate your palate and throat.  You may be tested for sleep apnea or to determine the severity of your sleep apnea. General instructions  Your health care provider may start you on a weight reduction program.  Do not use any products that contain nicotine or tobacco for at least 4 weeks before the procedure. These products include cigarettes, e-cigarettes, and chewing tobacco. If you need help quitting, ask your health care provider.  If you use a continuous positive airway pressure (CPAP) device, follow your health care provider's instructions about how often to use it before surgery. Bring your CPAP device to the hospital on the day of your surgery.  Plan to have someone take you home from the hospital or clinic.  If you will be going home right after the procedure, plan to have someone with you for 24 hours.  Ask your health care provider what steps will be taken to help  prevent infection. What happens during the procedure?  An IV will be inserted into one of your veins.  You will be given: ? A medicine to help you relax (sedative). ? A medicine to make you fall asleep (general anesthetic).  Small incisions will be made in the soft tissue at the back of your throat.  Throat tissue that blocks  your airway will be removed.  The incisions in your throat will be closed with stitches (sutures). These will absorb in the next few days or weeks. The procedure may vary among health care providers and hospitals. What happens after the procedure?  Your blood pressure, heart rate, breathing rate, and blood oxygen level will be monitored until you leave the hospital or clinic.  You may continue to receive fluids and medicine through an IV.  You will have some pain, especially when swallowing or talking. Pain medicines will be available to help you.  If you use a CPAP device, it may be placed on you after surgery.  The head of your bed will be kept at an upright angle.  Do not drive for 24 hours if you were given a sedative during the procedure. Summary  Uvulopalatopharyngoplasty is a surgery to remove soft tissue from the back of the throat.  This surgery may be done to treat sleep apnea by removing tissue that is causing you to have difficulty breathing when you sleep.  Follow instructions from your health care provider about taking medicines and about eating and drinking before the procedure.  If you use a CPAP device, follow your health care provider's instructions about how often to use it before surgery. Bring your CPAP device to the hospital on the day of your surgery. This information is not intended to replace advice given to you by your health care provider. Make sure you discuss any questions you have with your health care provider. Document Released: 01/20/2010 Document Revised: 10/04/2017 Document Reviewed: 09/26/2017 Elsevier Patient Education  2020 [employer].

## 2019-03-06 ENCOUNTER — Telehealth: Payer: Self-pay | Admitting: Pulmonary Disease

## 2019-03-06 NOTE — Telephone Encounter (Signed)
I will reschedule pt's appt.

## 2019-03-19 ENCOUNTER — Encounter: Payer: Self-pay | Admitting: Pulmonary Disease

## 2019-04-17 ENCOUNTER — Other Ambulatory Visit: Payer: Self-pay

## 2019-04-17 ENCOUNTER — Ambulatory Visit: Payer: BC Managed Care – PPO

## 2019-04-17 DIAGNOSIS — G4733 Obstructive sleep apnea (adult) (pediatric): Secondary | ICD-10-CM | POA: Diagnosis not present

## 2019-04-17 DIAGNOSIS — R0683 Snoring: Secondary | ICD-10-CM

## 2019-04-22 ENCOUNTER — Telehealth: Payer: Self-pay | Admitting: Pulmonary Disease

## 2019-04-22 DIAGNOSIS — G4733 Obstructive sleep apnea (adult) (pediatric): Secondary | ICD-10-CM

## 2019-04-22 NOTE — Telephone Encounter (Signed)
Dr. Ander Slade has reviewed the home sleep test this showed"  Moderate obstructive sleep apnea  Mild oxygen desaturations   Recommendations   Recommend CPAP therapy for moderate obstructive sleep apnea Treatment options are CPAP with the settings auto titrate settings 5-15 cm H2O   Close clinical follow up with compliance monitoring to optimize therapeutic efficiency Weight loss measures encouraged. Advise against driving while sleepy & against medication with sedative side effects.    Make appointment for 3 months for compliance with download with Dr. Ander Slade.   Patient identification verified, results of home sleep study reviewed, orders placed for new CPAP machine. Patient verbalized understanding of results, follow up appointment made.

## 2019-05-11 ENCOUNTER — Other Ambulatory Visit: Payer: Self-pay | Admitting: Family Medicine

## 2019-05-11 DIAGNOSIS — N529 Male erectile dysfunction, unspecified: Secondary | ICD-10-CM

## 2019-05-11 DIAGNOSIS — I10 Essential (primary) hypertension: Secondary | ICD-10-CM

## 2019-05-12 ENCOUNTER — Telehealth: Payer: Self-pay | Admitting: Family Medicine

## 2019-05-12 NOTE — Telephone Encounter (Signed)
Patient is calling and requesting a refill for [place] sent to [facility] on [facility]. CB is [phone]

## 2019-05-18 NOTE — Telephone Encounter (Signed)
Patient scheduled to come in for follow up on medications last OV November 2020 last refill for Ambien March 2020. Refill will be determined at visit.

## 2019-05-27 ENCOUNTER — Other Ambulatory Visit: Payer: Self-pay

## 2019-05-28 ENCOUNTER — Encounter: Payer: Self-pay | Admitting: Family Medicine

## 2019-05-28 ENCOUNTER — Ambulatory Visit (INDEPENDENT_AMBULATORY_CARE_PROVIDER_SITE_OTHER): Payer: BC Managed Care – PPO | Admitting: Family Medicine

## 2019-05-28 VITALS — BP 132/84 | HR 70 | Temp 98.0°F | Ht 72.0 in | Wt 243.0 lb

## 2019-05-28 DIAGNOSIS — I10 Essential (primary) hypertension: Secondary | ICD-10-CM

## 2019-05-28 DIAGNOSIS — G4700 Insomnia, unspecified: Secondary | ICD-10-CM

## 2019-05-28 DIAGNOSIS — R0982 Postnasal drip: Secondary | ICD-10-CM

## 2019-05-28 LAB — BASIC METABOLIC PANEL
BUN: 12 mg/dL (ref 6–23)
CO2: 30 mEq/L (ref 19–32)
Calcium: 9.3 mg/dL (ref 8.4–10.5)
Chloride: 110 mEq/L (ref 96–112)
Creatinine, Ser: 1.42 mg/dL (ref 0.40–1.50)
GFR: 52.86 mL/min — ABNORMAL LOW (ref >60.00)
Glucose, Bld: 95 mg/dL (ref 70–99)
Potassium: 3.6 mEq/L (ref 3.5–5.1)
Sodium: 137 mEq/L (ref 135–145)

## 2019-05-28 LAB — URINALYSIS, ROUTINE W REFLEX MICROSCOPIC
Bilirubin Urine: NEGATIVE
Leukocytes,Ua: NEGATIVE
Nitrite: NEGATIVE
Specific Gravity, Urine: 1.025 (ref 1.000–1.030)
Total Protein, Urine: NEGATIVE
Urine Glucose: NEGATIVE
Urobilinogen, UA: 0.2 (ref 0.0–1.0)
pH: 5.5 (ref 5.0–8.0)

## 2019-05-28 NOTE — Progress Notes (Deleted)
Established Patient Office Visit  Subjective:  Patient ID: Derek Davies, male    DOB: [birth year]  Age: 49 y.o. MRN: [id number]  CC:  Chief Complaint  Patient presents with  . Hypertension    Pt here for 6 month follow up and medication refill.    HPI Derek Davies presents for follow-up of his hypertension, insomnia and sleep apnea.  Blood pressures well been well controlled with the Hyzaar he is tolerating the medicine well.  He uses the Ambien occasionally and typically on Sunday nights before he has to go back to work on Monday.  This seems to be a stressful time for him.  He did have a sleep study and was diagnosed with moderate sleep apnea.  Currently using the nasal cannula and does not seem to be tolerating it well.  He has follow-up with the sleep technician next week. Allergies have been bothering him this spring.   Past Medical History:  Diagnosis Date  . Hypertension     Past Surgical History:  Procedure Laterality Date  . HAND SURGERY      Family History  Problem Relation Age of Onset  . Heart disease Mother   . Throat cancer Maternal Aunt   . Throat cancer Maternal Uncle     Social History   Socioeconomic History  . Marital status: Married    Spouse name: Not on file  . Number of children: Not on file  . Years of education: Not on file  . Highest education level: Not on file  Occupational History  . Not on file  Tobacco Use  . Smoking status: Former Smoker    Packs/day: 1.00    Years: 3.00    Pack years: 3.00    Types: Cigarettes  . Smokeless tobacco: Never Used  Substance and Sexual Activity  . Alcohol use: Not on file  . Drug use: Not on file  . Sexual activity: Not on file  Other Topics Concern  . Not on file  Social History Narrative  . Not on file   Social Determinants of Health   Financial Resource Strain:   . Difficulty of Paying Living Expenses:   Food Insecurity:   . Worried About Charity fundraiser in the Last Year:   .  Arboriculturist in the Last Year:   Transportation Needs:   . Film/video editor (Medical):   Marland Kitchen Lack of Transportation (Non-Medical):   Physical Activity:   . Days of Exercise per Week:   . Minutes of Exercise per Session:   Stress:   . Feeling of Stress :   Social Connections:   . Frequency of Communication with Friends and Family:   . Frequency of Social Gatherings with Friends and Family:   . Attends Religious Services:   . Active Member of Clubs or Organizations:   . Attends Archivist Meetings:   Marland Kitchen Marital Status:   Intimate Partner Violence:   . Fear of Current or Ex-Partner:   . Emotionally Abused:   Marland Kitchen Physically Abused:   . Sexually Abused:     Outpatient Medications Prior to Visit  Medication Sig Dispense Refill  . fluticasone (FLONASE) 50 MCG/ACT nasal spray Place 2 sprays into both nostrils daily. 16 g 6  . losartan-hydrochlorothiazide (HYZAAR) 100-25 MG tablet Take 1 tablet by mouth once daily 90 tablet 0  . sodium chloride (OCEAN) 0.65 % SOLN nasal spray Place 2 sprays into both nostrils as needed for congestion. 88 mL 12  .  tadalafil (CIALIS) 20 MG tablet TAKE 1 TABLET BY MOUTH ONCE DAILY AS NEEDED 10 tablet 0  . zolpidem (AMBIEN) 5 MG tablet TAKE 1 TABLET BY MOUTH ONCE DAILY AT BEDTIME AS NEEDED 30 tablet 0   No facility-administered medications prior to visit.    No Known Allergies  ROS Review of Systems  Constitutional: Negative.  Negative for fatigue and unexpected weight change.  HENT: Positive for congestion, postnasal drip and sneezing.   Respiratory: Negative.   Cardiovascular: Negative.   Gastrointestinal: Negative.   Endocrine: Negative for polyphagia and polyuria.  Genitourinary: Negative.   Musculoskeletal: Negative for gait problem and joint swelling.  Neurological: Negative for tremors and speech difficulty.  Hematological: Does not bruise/bleed easily.   Depression screen [facility] 2/9 05/28/2019 09/05/2017  Decreased Interest 0 0   Down, Depressed, Hopeless 0 0  PHQ - 2 Score 0 0  Altered sleeping 1 1  Tired, decreased energy 0 1  Change in appetite 0 0  Feeling bad or failure about yourself  0 0  Trouble concentrating 0 0  Moving slowly or fidgety/restless 0 0  Suicidal thoughts 0 0  PHQ-9 Score 1 2  Difficult doing work/chores Not difficult at all -      Objective:    Physical Exam  Constitutional: He is oriented to person, place, and time. He appears well-developed and well-nourished. No distress.  HENT:  Head: Normocephalic and atraumatic.  Right Ear: External ear normal.  Left Ear: External ear normal.  Eyes: Conjunctivae are normal. Right eye exhibits no discharge. Left eye exhibits no discharge. No scleral icterus.  Neck: No JVD present. No tracheal deviation present. No thyromegaly present.  Cardiovascular: Normal rate, regular rhythm and normal heart sounds.  Pulmonary/Chest: Effort normal and breath sounds normal. No stridor.  Lymphadenopathy:    He has no cervical adenopathy.  Neurological: He is alert and oriented to person, place, and time.  Skin: Skin is warm and dry. He is not diaphoretic.  Psychiatric: He has a normal mood and affect. His behavior is normal.    BP 132/84 (BP Location: Left Arm, Patient Position: Sitting, Cuff Size: Normal)   Pulse 70   Temp 98 F (36.7 C) (Temporal)   Ht 6' (1.829 m)   Wt 243 lb (110.2 kg)   SpO2 96%   BMI 32.96 kg/m  Wt Readings from Last 3 Encounters:  05/28/19 243 lb (110.2 kg)  12/17/18 250 lb (113.4 kg)  11/20/18 242 lb (109.8 kg)     Health Maintenance Due  Topic Date Due  . COVID-19 Vaccine (1) Never done  . HIV Screening  Never done  . TETANUS/TDAP  Never done    There are no preventive care reminders to display for this patient.  No results found for: TSH Lab Results  Component Value Date   WBC 7.4 11/20/2018   HGB 15.8 11/20/2018   HCT 45.8 11/20/2018   MCV 93.9 11/20/2018   PLT 251.0 11/20/2018   Lab Results   Component Value Date   NA 142 11/20/2018   K 4.0 11/20/2018   CO2 32 11/20/2018   GLUCOSE 86 11/20/2018   BUN 12 11/20/2018   CREATININE 1.15 11/20/2018   BILITOT 0.5 11/20/2018   ALKPHOS 56 11/20/2018   AST 21 11/20/2018   ALT 32 11/20/2018   PROT 7.2 11/20/2018   ALBUMIN 4.6 11/20/2018   CALCIUM 9.8 11/20/2018   GFR 67.56 11/20/2018   Lab Results  Component Value Date   CHOL 170 09/11/2017  Lab Results  Component Value Date   HDL 44.60 09/11/2017   Lab Results  Component Value Date   LDLCALC 100 (H) 09/11/2017   Lab Results  Component Value Date   TRIG 126.0 09/11/2017   Lab Results  Component Value Date   CHOLHDL 4 09/11/2017   No results found for: HGBA1C    Assessment & Plan:   Problem List Items Addressed This Visit      Cardiovascular and Mediastinum   Essential hypertension     Other   Insomnia   Post-nasal drip - Primary      No orders of the defined types were placed in this encounter.   Follow-up: Return in about 6 months (around 11/28/2019).  Continue Hyzaar daily.  Use Ambien as needed.  And working prescription was given him today for 30 pills without refills.  Advised him to use Flonase or Nasonex on a regular basis to treat his allergies  Libby Maw, MD

## 2019-05-28 NOTE — Patient Instructions (Signed)
Health Maintenance Due  Topic Date Due  . COVID-19 Vaccine (1) Never done  . HIV Screening  Never done  . TETANUS/TDAP  Never done    Depression screen PHQ 2/9 09/05/2017  Decreased Interest 0  Down, Depressed, Hopeless 0  PHQ - 2 Score 0  Altered sleeping 1  Tired, decreased energy 1  Change in appetite 0  Feeling bad or failure about yourself  0  Trouble concentrating 0  Moving slowly or fidgety/restless 0  Suicidal thoughts 0  PHQ-9 Score 2

## 2019-05-28 NOTE — Progress Notes (Signed)
Established Patient Office Visit  Subjective:  Patient ID: Derek Davies, male    DOB: Apr 10, 1970  Age: 49 y.o. MRN: [id number]  CC:  Chief Complaint  Patient presents with  . Hypertension    Pt here for 6 month follow up and medication refill.    HPI Derek Davies presents for follow-up of his hypertension, insomnia and sleep apnea.  Blood pressures well been well controlled with the Hyzaar he is tolerating the medicine well.  He uses the Ambien occasionally and typically on Sunday nights before he has to go back to work on Monday.  This seems to be a stressful time for him.  He did have a sleep study and was diagnosed with moderate sleep apnea.  Currently using the nasal cannula and does not seem to be tolerating it well.  He has follow-up with the sleep technician next week. Allergies have been bothering him this spring.   Past Medical History:  Diagnosis Date  . Hypertension     Past Surgical History:  Procedure Laterality Date  . HAND SURGERY      Family History  Problem Relation Age of Onset  . Heart disease Mother   . Throat cancer Maternal Aunt   . Throat cancer Maternal Uncle     Social History   Socioeconomic History  . Marital status: Married    Spouse name: Not on file  . Number of children: Not on file  . Years of education: Not on file  . Highest education level: Not on file  Occupational History  . Not on file  Tobacco Use  . Smoking status: Former Smoker    Packs/day: 1.00    Years: 3.00    Pack years: 3.00    Types: Cigarettes  . Smokeless tobacco: Never Used  Substance and Sexual Activity  . Alcohol use: Not Currently    Comment: Recovering  . Drug use: Not Currently  . Sexual activity: Not on file  Other Topics Concern  . Not on file  Social History Narrative  . Not on file   Social Determinants of Health   Financial Resource Strain:   . Difficulty of Paying Living Expenses:   Food Insecurity:   . Worried About Sales executive in the Last Year:   . Arboriculturist in the Last Year:   Transportation Needs:   . Film/video editor (Medical):   Marland Kitchen Lack of Transportation (Non-Medical):   Physical Activity:   . Days of Exercise per Week:   . Minutes of Exercise per Session:   Stress:   . Feeling of Stress :   Social Connections:   . Frequency of Communication with Friends and Family:   . Frequency of Social Gatherings with Friends and Family:   . Attends Religious Services:   . Active Member of Clubs or Organizations:   . Attends Archivist Meetings:   Marland Kitchen Marital Status:   Intimate Partner Violence:   . Fear of Current or Ex-Partner:   . Emotionally Abused:   Marland Kitchen Physically Abused:   . Sexually Abused:     Outpatient Medications Prior to Visit  Medication Sig Dispense Refill  . fluticasone (FLONASE) 50 MCG/ACT nasal spray Place 2 sprays into both nostrils daily. 16 g 6  . losartan-hydrochlorothiazide (HYZAAR) 100-25 MG tablet Take 1 tablet by mouth once daily 90 tablet 0  . sodium chloride (OCEAN) 0.65 % SOLN nasal spray Place 2 sprays into both nostrils as needed for congestion.  88 mL 12  . tadalafil (CIALIS) 20 MG tablet TAKE 1 TABLET BY MOUTH ONCE DAILY AS NEEDED 10 tablet 0  . zolpidem (AMBIEN) 5 MG tablet TAKE 1 TABLET BY MOUTH ONCE DAILY AT BEDTIME AS NEEDED 30 tablet 0   No facility-administered medications prior to visit.    No Known Allergies  ROS Review of Systems  Constitutional: Negative.  Negative for fatigue and unexpected weight change.  HENT: Positive for congestion, postnasal drip and sneezing.   Respiratory: Negative.   Cardiovascular: Negative.   Gastrointestinal: Negative.   Endocrine: Negative for polyphagia and polyuria.  Genitourinary: Negative.   Musculoskeletal: Negative for gait problem and joint swelling.  Neurological: Negative for tremors and speech difficulty.  Hematological: Does not bruise/bleed easily.   Depression screen [facility] 2/9 05/28/2019 09/05/2017   Decreased Interest 0 0  Down, Depressed, Hopeless 0 0  PHQ - 2 Score 0 0  Altered sleeping 1 1  Tired, decreased energy 0 1  Change in appetite 0 0  Feeling bad or failure about yourself  0 0  Trouble concentrating 0 0  Moving slowly or fidgety/restless 0 0  Suicidal thoughts 0 0  PHQ-9 Score 1 2  Difficult doing work/chores Not difficult at all -      Objective:    Physical Exam  Constitutional: He is oriented to person, place, and time. He appears well-developed and well-nourished. No distress.  HENT:  Head: Normocephalic and atraumatic.  Right Ear: External ear normal.  Left Ear: External ear normal.  Eyes: Conjunctivae are normal. Right eye exhibits no discharge. Left eye exhibits no discharge. No scleral icterus.  Neck: No JVD present. No tracheal deviation present. No thyromegaly present.  Cardiovascular: Normal rate, regular rhythm and normal heart sounds.  Pulmonary/Chest: Effort normal and breath sounds normal. No stridor.  Lymphadenopathy:    He has no cervical adenopathy.  Neurological: He is alert and oriented to person, place, and time.  Skin: Skin is warm and dry. He is not diaphoretic.  Psychiatric: He has a normal mood and affect. His behavior is normal.    BP 132/84 (BP Location: Left Arm, Patient Position: Sitting, Cuff Size: Normal)   Pulse 70   Temp 98 F (36.7 C) (Temporal)   Ht 6' (1.829 m)   Wt 243 lb (110.2 kg)   SpO2 96%   BMI 32.96 kg/m  Wt Readings from Last 3 Encounters:  05/28/19 243 lb (110.2 kg)  12/17/18 250 lb (113.4 kg)  11/20/18 242 lb (109.8 kg)     Health Maintenance Due  Topic Date Due  . COVID-19 Vaccine (1) Never done  . HIV Screening  Never done  . TETANUS/TDAP  Never done    There are no preventive care reminders to display for this patient.  No results found for: TSH Lab Results  Component Value Date   WBC 7.4 11/20/2018   HGB 15.8 11/20/2018   HCT 45.8 11/20/2018   MCV 93.9 11/20/2018   PLT 251.0  11/20/2018   Lab Results  Component Value Date   NA 142 11/20/2018   K 4.0 11/20/2018   CO2 32 11/20/2018   GLUCOSE 86 11/20/2018   BUN 12 11/20/2018   CREATININE 1.15 11/20/2018   BILITOT 0.5 11/20/2018   ALKPHOS 56 11/20/2018   AST 21 11/20/2018   ALT 32 11/20/2018   PROT 7.2 11/20/2018   ALBUMIN 4.6 11/20/2018   CALCIUM 9.8 11/20/2018   GFR 67.56 11/20/2018   Lab Results  Component Value Date  CHOL 170 09/11/2017   Lab Results  Component Value Date   HDL 44.60 09/11/2017   Lab Results  Component Value Date   LDLCALC 100 (H) 09/11/2017   Lab Results  Component Value Date   TRIG 126.0 09/11/2017   Lab Results  Component Value Date   CHOLHDL 4 09/11/2017   No results found for: HGBA1C    Assessment & Plan:   Problem List Items Addressed This Visit      Cardiovascular and Mediastinum   Essential hypertension     Other   Insomnia   Post-nasal drip - Primary      No orders of the defined types were placed in this encounter.   Follow-up: Return in about 6 months (around 11/28/2019).  Continue Hyzaar daily.  Use Ambien as needed.  And working prescription was given him today for 30 pills without refills.  Advised him to use Flonase or Nasonex on a regular basis to treat his allergies  Libby Maw, MD

## 2019-05-28 NOTE — Addendum Note (Signed)
Addended by: Lynnea Ferrier on: 05/28/2019 02:00 PM   Modules accepted: Orders

## 2019-06-12 ENCOUNTER — Other Ambulatory Visit: Payer: Self-pay

## 2019-06-12 DIAGNOSIS — N529 Male erectile dysfunction, unspecified: Secondary | ICD-10-CM

## 2019-06-12 DIAGNOSIS — G4700 Insomnia, unspecified: Secondary | ICD-10-CM

## 2019-06-12 MED ORDER — ZOLPIDEM TARTRATE 5 MG PO TABS: ORAL_TABLET | ORAL | 0 refills | Status: DC

## 2019-06-12 MED ORDER — TADALAFIL 20 MG PO TABS: 20.0000 mg | ORAL_TABLET | Freq: Every day | ORAL | 0 refills | Status: DC | PRN

## 2019-06-12 NOTE — Telephone Encounter (Signed)
Last OV 05/28/19 Last fill 04/01/18  #30/0

## 2019-06-16 ENCOUNTER — Telehealth: Payer: Self-pay | Admitting: Pulmonary Disease

## 2019-06-16 NOTE — Telephone Encounter (Signed)
Patient requesting pressure change to CPAP. Patient reports the pressure is so high he feels like he cannot breath out.

## 2019-06-17 NOTE — Telephone Encounter (Signed)
Auto titrating machine is usually self adjust to give you the lowest pressure needed to keep your airway open  Machine is set at 5-15  We can have the machine set at 5-10, but this will limit the pressure that the machine can go to in case you should require higher pressures  Using the machine for a few weeks to allow [place] to get a download from the machine might help [place] determine the pressure requirements.   Let us know if you still want [place] to limit the pressure to 5-10

## 2019-06-17 NOTE — Telephone Encounter (Signed)
Called patient and relayed message from Dr. Ander Slade. He is willing to stay on current pressures until next visit so we can assess his compliance download.

## 2019-06-23 DIAGNOSIS — G4733 Obstructive sleep apnea (adult) (pediatric): Secondary | ICD-10-CM | POA: Diagnosis not present

## 2019-07-23 ENCOUNTER — Other Ambulatory Visit: Payer: Self-pay

## 2019-07-23 DIAGNOSIS — N529 Male erectile dysfunction, unspecified: Secondary | ICD-10-CM

## 2019-07-23 DIAGNOSIS — G4700 Insomnia, unspecified: Secondary | ICD-10-CM

## 2019-07-23 MED ORDER — TADALAFIL 20 MG PO TABS: 20.0000 mg | ORAL_TABLET | Freq: Every day | ORAL | 1 refills | Status: DC | PRN

## 2019-07-23 MED ORDER — ZOLPIDEM TARTRATE 5 MG PO TABS: 5.0000 mg | ORAL_TABLET | Freq: Every evening | ORAL | 1 refills | Status: DC | PRN

## 2019-07-23 NOTE — Telephone Encounter (Signed)
Please advise on refill.

## 2019-12-24 ENCOUNTER — Other Ambulatory Visit: Payer: Self-pay | Admitting: Family Medicine

## 2019-12-24 DIAGNOSIS — I10 Essential (primary) hypertension: Secondary | ICD-10-CM

## 2020-01-20 ENCOUNTER — Telehealth: Payer: Self-pay

## 2020-01-20 NOTE — Telephone Encounter (Signed)
Pt calling to ask if he could change providers.  Pt would like to change to Dr. Bryan Lemma.  Please advise.

## 2020-01-20 NOTE — Telephone Encounter (Signed)
Unfortunately I am not able to accommodate this, as I am part time and have taken on many new patients with 2 providers leaving in the past year. Dr. Gena Fray would be an option

## 2020-01-26 ENCOUNTER — Ambulatory Visit: Payer: BC Managed Care – PPO | Admitting: Family

## 2020-01-26 ENCOUNTER — Encounter: Payer: Self-pay | Admitting: Family

## 2020-01-26 ENCOUNTER — Other Ambulatory Visit: Payer: Self-pay

## 2020-01-26 VITALS — BP 138/74 | HR 77 | Temp 98.6°F | Ht 72.0 in | Wt 240.8 lb

## 2020-01-26 DIAGNOSIS — N529 Male erectile dysfunction, unspecified: Secondary | ICD-10-CM

## 2020-01-26 DIAGNOSIS — I10 Essential (primary) hypertension: Secondary | ICD-10-CM | POA: Diagnosis not present

## 2020-01-26 DIAGNOSIS — J342 Deviated nasal septum: Secondary | ICD-10-CM

## 2020-01-26 DIAGNOSIS — K439 Ventral hernia without obstruction or gangrene: Secondary | ICD-10-CM

## 2020-01-26 DIAGNOSIS — G4700 Insomnia, unspecified: Secondary | ICD-10-CM

## 2020-01-26 DIAGNOSIS — K429 Umbilical hernia without obstruction or gangrene: Secondary | ICD-10-CM

## 2020-01-26 NOTE — Patient Instructions (Signed)
Hernia, Adult     A hernia happens when tissue inside your body pushes out through a weak spot in your belly muscles (abdominal wall). This makes a round lump (bulge). The lump may be:  In a scar from surgery that was done in your belly (incisional hernia).  Near your belly button (umbilical hernia).  In your groin (inguinal hernia). Your groin is the area where your leg meets your lower belly (abdomen). This kind of hernia could also be: ? In your scrotum, if you are male. ? In folds of skin around your vagina, if you are male.  In your upper thigh (femoral hernia).  Inside your belly (hiatal hernia). This happens when your stomach slides above the muscle between your belly and your chest (diaphragm). If your hernia is small and it does not cause pain, you may not need treatment. If your hernia is large or it causes pain, you may need surgery. Follow these instructions at home: Activity  Avoid stretching or overusing (straining) the muscles near your hernia. Straining can happen when you: ? Lift something heavy. ? Poop (have a bowel movement).  Do not lift anything that is heavier than 10 lb (4.5 kg), or the limit that you are told, until your doctor says that it is safe.  Use the strength of your legs when you lift something heavy. Do not use only your back muscles to lift. General instructions  Do these things if told by your doctor so you do not have trouble pooping (constipation): ? Drink enough fluid to keep your pee (urine) pale yellow. ? Eat foods that are high in fiber. These include fresh fruits and vegetables, whole grains, and beans. ? Limit foods that are high in fat and processed sugars. These include foods that are fried or sweet. ? Take medicine for trouble pooping.  When you cough, try to cough gently.  You may try to push your hernia in by very gently pressing on it when you are lying down. Do not try to force the bulge back in if it will not push in  easily.  If you are overweight, work with your doctor to lose weight safely.  Do not use any products that have nicotine or tobacco in them. These include cigarettes and e-cigarettes. If you need help quitting, ask your doctor.  If you will be having surgery (hernia repair), watch your hernia for changes in shape, size, or color. Tell your doctor if you see any changes.  Take over-the-counter and prescription medicines only as told by your doctor.  Keep all follow-up visits as told by your doctor. Contact a doctor if:  You get new pain, swelling, or redness near your hernia.  You poop fewer times in a week than normal.  You have trouble pooping.  You have poop (stool) that is more dry than normal.  You have poop that is harder or larger than normal. Get help right away if:  You have a fever.  You have belly pain that gets worse.  You feel sick to your stomach (nauseous).  You throw up (vomit).  Your hernia cannot be pushed in by very gently pressing on it when you are lying down. Do not try to force the bulge back in if it will not push in easily.  Your hernia: ? Changes in shape or size. ? Changes color. ? Feels hard or it hurts when you touch it. These symptoms may represent a serious problem that is an emergency. Do not   wait to see if the symptoms will go away. Get medical help right away. Call your local emergency services (911 in the [place] Summary  A hernia happens when tissue inside your body pushes out through a weak spot in the belly muscles. This creates a bulge.  If your hernia is small and it does not hurt, you may not need treatment. If your hernia is large or it hurts, you may need surgery.  If you will be having surgery, watch your hernia for changes in shape, size, or color. Tell your doctor about any changes. This information is not intended to replace advice given to you by your health care provider. Make sure you discuss any questions you have with  your health care provider. Document Revised: 04/10/2018 Document Reviewed: 09/19/2016 Elsevier Patient Education  [place].

## 2020-01-27 LAB — CBC WITH DIFFERENTIAL/PLATELET
Basophils Absolute: 0.1 10*3/uL (ref 0.0–0.1)
Basophils Relative: 0.9 % (ref 0.0–3.0)
Eosinophils Absolute: 0 10*3/uL (ref 0.0–0.7)
Eosinophils Relative: 0.6 % (ref 0.0–5.0)
HCT: 44.9 % (ref 39.0–52.0)
Hemoglobin: 15.5 g/dL (ref 13.0–17.0)
Lymphocytes Relative: 34.5 % (ref 12.0–46.0)
Lymphs Abs: 2.4 10*3/uL (ref 0.7–4.0)
MCHC: 34.6 g/dL (ref 30.0–36.0)
MCV: 92.3 fl (ref 78.0–100.0)
Monocytes Absolute: 0.5 10*3/uL (ref 0.1–1.0)
Monocytes Relative: 6.7 % (ref 3.0–12.0)
Neutro Abs: 4 10*3/uL (ref 1.4–7.7)
Neutrophils Relative %: 57.3 % (ref 43.0–77.0)
Platelets: 231 10*3/uL (ref 150.0–400.0)
RBC: 4.87 Mil/uL (ref 4.22–5.81)
RDW: 13.1 % (ref 11.5–15.5)
WBC: 6.9 10*3/uL (ref 4.0–10.5)

## 2020-01-27 LAB — COMPREHENSIVE METABOLIC PANEL
ALT: 25 U/L (ref 0–53)
AST: 19 U/L (ref 0–37)
Albumin: 4.6 g/dL (ref 3.5–5.2)
Alkaline Phosphatase: 63 U/L (ref 39–117)
BUN: 9 mg/dL (ref 6–23)
CO2: 29 mEq/L (ref 19–32)
Calcium: 10.2 mg/dL (ref 8.4–10.5)
Chloride: 103 mEq/L (ref 96–112)
Creatinine, Ser: 1.12 mg/dL (ref 0.40–1.50)
GFR: 76.72 mL/min (ref >60.00)
Glucose, Bld: 94 mg/dL (ref 70–99)
Potassium: 3.3 mEq/L — ABNORMAL LOW (ref 3.5–5.1)
Sodium: 143 mEq/L (ref 135–145)
Total Bilirubin: 0.6 mg/dL (ref 0.2–1.2)
Total Protein: 7.3 g/dL (ref 6.0–8.3)

## 2020-01-27 LAB — TESTOSTERONE: Testosterone: 408.37 ng/dL (ref 300.00–890.00)

## 2020-01-28 NOTE — Progress Notes (Signed)
Acute Office Visit  Subjective:    Patient ID: Derek Davies, male    DOB: [birth year], 50 y.o.   MRN: [id number]  Chief Complaint  Patient presents with  . Follow-up    Follow up on medications, would like testosterone levels checked also would like hernias checked.     HPI Patient is in today for a follow-up. He has a history of HTN and needs medication refills. He is requesting to have his testosterone checked. Also has concerns of abdominal hernias that he reports have been present for years but recently felt a sharp pain in one of them that has since resolved. They are soft and non tender. He denies any nausea or vomiting. Reports the hernias being more pronounced when he does abdominal exercises. Would like a referral to have them repaired  Past Medical History:  Diagnosis Date  . Hypertension     Past Surgical History:  Procedure Laterality Date  . HAND SURGERY      Family History  Problem Relation Age of Onset  . Heart disease Mother   . Throat cancer Maternal Aunt   . Throat cancer Maternal Uncle     Social History   Socioeconomic History  . Marital status: Married    Spouse name: Not on file  . Number of children: Not on file  . Years of education: Not on file  . Highest education level: Not on file  Occupational History  . Not on file  Tobacco Use  . Smoking status: Former Smoker    Packs/day: 1.00    Years: 3.00    Pack years: 3.00    Types: Cigarettes  . Smokeless tobacco: Never Used  Substance and Sexual Activity  . Alcohol use: Not Currently    Comment: Recovering  . Drug use: Not Currently  . Sexual activity: Not on file  Other Topics Concern  . Not on file  Social History Narrative  . Not on file   Social Determinants of Health   Financial Resource Strain: Not on file  Food Insecurity: Not on file  Transportation Needs: Not on file  Physical Activity: Not on file  Stress: Not on file  Social Connections: Not on file  Intimate  Partner Violence: Not on file    Outpatient Medications Prior to Visit  Medication Sig Dispense Refill  . losartan-hydrochlorothiazide (HYZAAR) 100-25 MG tablet Take 1 tablet by mouth once daily 90 tablet 0  . sodium chloride (OCEAN) 0.65 % SOLN nasal spray Place 2 sprays into both nostrils as needed for congestion. 88 mL 12  . tadalafil (CIALIS) 20 MG tablet Take 1 tablet (20 mg total) by mouth daily as needed. 30 tablet 1  . zolpidem (AMBIEN) 5 MG tablet Take 1 tablet (5 mg total) by mouth at bedtime as needed for sleep. 90 tablet 1  . fluticasone (FLONASE) 50 MCG/ACT nasal spray Place 2 sprays into both nostrils daily. (Patient not taking: Reported on 01/26/2020) 16 g 6   No facility-administered medications prior to visit.    No Known Allergies  Review of Systems  Constitutional: Negative.   HENT: Negative.   Respiratory: Negative.   Cardiovascular: Negative.   Gastrointestinal: Negative for abdominal pain, constipation, nausea and vomiting.       Abdominal hernias  Endocrine: Negative.   Musculoskeletal: Negative.   Skin: Negative.   Allergic/Immunologic: Negative.   Neurological: Negative.   Psychiatric/Behavioral: Negative.        Objective:    Physical Exam Constitutional:  Appearance: Normal appearance.  HENT:     Mouth/Throat:     Mouth: Mucous membranes are moist.  Cardiovascular:     Rate and Rhythm: Normal rate and regular rhythm.     Pulses: Normal pulses.     Heart sounds: Normal heart sounds.  Pulmonary:     Effort: Pulmonary effort is normal.     Breath sounds: Normal breath sounds.  Abdominal:     General: Bowel sounds are normal.     Palpations: Abdomen is rigid.    Musculoskeletal:     Cervical back: Normal range of motion and neck supple.  Neurological:     Mental Status: He is alert.     BP 138/74   Pulse 77   Temp 98.6 F (37 C) (Temporal)   Ht 6' (1.829 m)   Wt 240 lb 12.8 oz (109.2 kg)   SpO2 93%   BMI 32.66 kg/m  Wt  Readings from Last 3 Encounters:  01/26/20 240 lb 12.8 oz (109.2 kg)  05/28/19 243 lb (110.2 kg)  12/17/18 250 lb (113.4 kg)    Health Maintenance Due  Topic Date Due  . Hepatitis C Screening  Never done  . HIV Screening  Never done  . TETANUS/TDAP  Never done  . COLONOSCOPY (Pts 45-81yrs Insurance coverage will need to be confirmed)  Never done  . INFLUENZA VACCINE  08/02/2019    There are no preventive care reminders to display for this patient.   No results found for: TSH Lab Results  Component Value Date   WBC 6.9 01/26/2020   HGB 15.5 01/26/2020   HCT 44.9 01/26/2020   MCV 92.3 01/26/2020   PLT 231.0 01/26/2020   Lab Results  Component Value Date   NA 143 01/26/2020   K 3.3 (L) 01/26/2020   CO2 29 01/26/2020   GLUCOSE 94 01/26/2020   BUN 9 01/26/2020   CREATININE 1.12 01/26/2020   BILITOT 0.6 01/26/2020   ALKPHOS 63 01/26/2020   AST 19 01/26/2020   ALT 25 01/26/2020   PROT 7.3 01/26/2020   ALBUMIN 4.6 01/26/2020   CALCIUM 10.2 01/26/2020   GFR 76.72 01/26/2020   Lab Results  Component Value Date   CHOL 170 09/11/2017   Lab Results  Component Value Date   HDL 44.60 09/11/2017   Lab Results  Component Value Date   LDLCALC 100 (H) 09/11/2017   Lab Results  Component Value Date   TRIG 126.0 09/11/2017   Lab Results  Component Value Date   CHOLHDL 4 09/11/2017   No results found for: HGBA1C     Assessment & Plan:   Problem List Items Addressed This Visit    Insomnia   Relevant Orders   CBC w/Diff (Completed)   Essential hypertension - Primary   Relevant Orders   Comp Met (CMET) (Completed)   Erectile dysfunction   Relevant Orders   Testosterone (Completed)    Other Visit Diagnoses    Hernia of abdominal wall       Relevant Orders   Ambulatory referral to General Surgery   CBC w/Diff (Completed)   Umbilical hernia without obstruction and without gangrene       Relevant Orders   Ambulatory referral to General Surgery   CBC w/Diff  (Completed)   Deviated septum       Relevant Orders   Ambulatory referral to ENT       No orders of the defined types were placed in this encounter.    [employer]  Clelia Croft, FNP

## 2020-01-29 ENCOUNTER — Telehealth: Payer: Self-pay | Admitting: Family Medicine

## 2020-01-29 NOTE — Telephone Encounter (Signed)
Patient returned call regarding labs. He said to give him about 20 minutes before calling back and he will be available.

## 2020-01-29 NOTE — Telephone Encounter (Signed)
Returned patients call.

## 2020-02-01 NOTE — Telephone Encounter (Signed)
Left a detailed message for pt about Dr. Lurline Del message below.  Asked pt to call office if any questions.

## 2020-02-08 ENCOUNTER — Telehealth: Payer: Self-pay | Admitting: Family Medicine

## 2020-02-08 NOTE — Telephone Encounter (Signed)
Patient would like to see Dr Louretta Shorten or Dr. Grandville Silos her in [place]. He does not want to go to [place]. Pleas advise.

## 2020-02-08 NOTE — Addendum Note (Signed)
Addended by: Lynda Rainwater on: 02/08/2020 02:24 PM   Modules accepted: Orders

## 2020-02-08 NOTE — Telephone Encounter (Signed)
Patient is calling to check the status of his referral for his hernia. He states that he has not heard from anyone. Did not see a referral in chart. Please advise.  Patients contact info: [phone]

## 2020-02-08 NOTE — Telephone Encounter (Signed)
Spoke with patient who verbally understood notes specify to possible schedule appointment with doctor in [place]. Patient would like appointment with Dr. Louretta Shorten or dr. Grandville Silos.

## 2020-02-08 NOTE — Telephone Encounter (Signed)
Referral put in patient aware

## 2020-02-17 ENCOUNTER — Ambulatory Visit: Payer: BLUE CROSS/BLUE SHIELD | Admitting: Surgery

## 2020-02-18 ENCOUNTER — Other Ambulatory Visit: Payer: Self-pay

## 2020-02-18 ENCOUNTER — Ambulatory Visit (INDEPENDENT_AMBULATORY_CARE_PROVIDER_SITE_OTHER): Payer: BC Managed Care – PPO | Admitting: Otolaryngology

## 2020-02-18 ENCOUNTER — Encounter (INDEPENDENT_AMBULATORY_CARE_PROVIDER_SITE_OTHER): Payer: Self-pay | Admitting: Otolaryngology

## 2020-02-18 VITALS — Temp 97.7°F

## 2020-02-18 DIAGNOSIS — J342 Deviated nasal septum: Secondary | ICD-10-CM | POA: Diagnosis not present

## 2020-02-18 DIAGNOSIS — J3489 Other specified disorders of nose and nasal sinuses: Secondary | ICD-10-CM

## 2020-02-18 DIAGNOSIS — G4733 Obstructive sleep apnea (adult) (pediatric): Secondary | ICD-10-CM | POA: Diagnosis not present

## 2020-02-18 NOTE — Progress Notes (Signed)
HPI: Derek Davies is a 50 y.o. male who presents is referred by Dutch Quint, FNP for evaluation of deviated septum.  Patient was diagnosed with obstructive sleep apnea about a year ago and uses a nasal CPAP machine but has difficulty using this because of nasal obstruction.  Even when he uses the machine he tends to snore.  He has tried using Flonase without much benefit he has also used saline sprays.  He always has difficulty breathing through his nose especially on the right side.  He has difficulty using the nasal CPAP because of the nasal obstruction. He has history of hypertension but no history of heart disease.  Past Medical History:  Diagnosis Date  . Hypertension    Past Surgical History:  Procedure Laterality Date  . HAND SURGERY     Social History   Socioeconomic History  . Marital status: Married    Spouse name: Not on file  . Number of children: Not on file  . Years of education: Not on file  . Highest education level: Not on file  Occupational History  . Not on file  Tobacco Use  . Smoking status: Current Some Day Smoker    Packs/day: 1.00    Years: 3.00    Pack years: 3.00    Types: Cigarettes, Cigars  . Smokeless tobacco: Never Used  . Tobacco comment: still smoke cigars once and a while/ no cigs.  Substance and Sexual Activity  . Alcohol use: Not Currently    Comment: Recovering  . Drug use: Not Currently  . Sexual activity: Not on file  Other Topics Concern  . Not on file  Social History Narrative  . Not on file   Social Determinants of Health   Financial Resource Strain: Not on file  Food Insecurity: Not on file  Transportation Needs: Not on file  Physical Activity: Not on file  Stress: Not on file  Social Connections: Not on file   Family History  Problem Relation Age of Onset  . Heart disease Mother   . Throat cancer Maternal Aunt   . Throat cancer Maternal Uncle    No Known Allergies Prior to Admission medications   Medication Sig  Start Date End Date Taking? Authorizing Provider  fluticasone (FLONASE) 50 MCG/ACT nasal spray Place 2 sprays into both nostrils daily. Patient not taking: Reported on 01/26/2020 11/20/18   Libby Maw, MD  losartan-hydrochlorothiazide [facility] 100-25 MG tablet Take 1 tablet by mouth once daily 12/27/19   Dutch Quint B, FNP  sodium chloride (OCEAN) 0.65 % SOLN nasal spray Place 2 sprays into both nostrils as needed for congestion. 11/20/18   Libby Maw, MD  tadalafil (CIALIS) 20 MG tablet Take 1 tablet (20 mg total) by mouth daily as needed. 07/23/19   Libby Maw, MD  zolpidem (AMBIEN) 5 MG tablet Take 1 tablet (5 mg total) by mouth at bedtime as needed for sleep. 07/23/19   Libby Maw, MD     Positive ROS: Otherwise negative  All other systems have been reviewed and were otherwise negative with the exception of those mentioned in the HPI and as above.  Physical Exam: Constitutional: Alert, well-appearing, no acute distress Ears: External ears without lesions or tenderness. Ear canals are clear bilaterally with intact, clear TMs.  Nasal: External nose without lesions. Septum is deviated to the right side.  There are no polyps noted within the nasal cavity.  Both middle meatus regions were clear..  Oral: Lips and gums without  lesions. Tongue and palate mucosa without lesions. Posterior oropharynx clear.  Average size 2+ tonsils bilaterally. Neck: No palpable adenopathy or masses Cardiac exam: Regular rate and rhythm without murmur Lungs: Clear to auscultation Respiratory: Breathing comfortably  Skin: No facial/neck lesions or rash noted.  Procedures  Assessment: Obstructive sleep apnea Nasal obstruction secondary to deviated septum and turbinate per trophy.  Patient with difficulty using nasal CPAP  Plan: Discussed septoplasty and turbinate reductions with the patient as this should significantly help improve his nasal breathing and use of  the nasal CPAP for his obstructive sleep apnea. Would also recommend weight loss as he is moderately obese.  He apparently has an abdominal ventral hernia that needs to be repaired sometime in the next few months.   Radene Journey, MD   CC:

## 2020-03-11 ENCOUNTER — Ambulatory Visit: Payer: Self-pay | Admitting: General Surgery

## 2020-03-11 DIAGNOSIS — K429 Umbilical hernia without obstruction or gangrene: Secondary | ICD-10-CM | POA: Diagnosis not present

## 2020-03-11 DIAGNOSIS — D171 Benign lipomatous neoplasm of skin and subcutaneous tissue of trunk: Secondary | ICD-10-CM | POA: Diagnosis not present

## 2020-03-11 NOTE — H&P (Signed)
Derek Davies Appointment: 03/11/2020 2:10 PM Location: [facility] Patient #: [id number] DOB: [birth year] Married / Language: Derek Davies / Race: White Male   History of Present Illness Derek Neri E. Grandville Silos MD; 03/11/2020 2:13 PM) The patient is a 50 year old male who presents with an umbilical hernia. Dr. Ethelene Hal asked me to see Derek Davies in consultation regarding an umbilical hernia, possible ventral hernia, and possible right upper quadrant abdominal hernia. Regarding his umbilical area, for about 2 years he has noticed a small bulge at the upper portion. Recently, he began exercising more often and he had some pain in the area. It tends to stay out all the time. He does not have any skin changes. Regarding his midline, again doing the exercises he noticed some bulging along his upper midline. No discomfort. Spontaneously reduces. Regarding his right upper quadrant, he also felt some discomfort and a small bulge a few centimeters inferior to his costal margin. Again, no skin changes there. He has not had previous surgery on any of these locations. No change in bowel or bladder habits. 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 His wife is a Marine scientist Derek Davies.   Diagnostic Studies History Derek Davies, [place]; 03/11/2020 2:13 PM) Colonoscopy  >10 years ago  Social History Derek Davies, [place]; 03/11/2020 2:13 PM) Alcohol use  Occasional alcohol use. Caffeine use  Carbonated beverages. Illicit drug use  Remotely quit drug use. Tobacco use  Current every day smoker.  Family History Derek Davies, [place]; 03/11/2020 2:13 PM) Colon Polyps  Derek Davies. Depression  Derek Davies. Heart Disease  Derek Davies. Heart disease in male family member before age 44  Hypertension  Derek Davies. Melanoma  Derek Davies. Respiratory Condition  Derek Davies.  Other Problems Derek Davies, [place]; 03/11/2020 2:13 PM) Gastroesophageal Reflux Disease  Hemorrhoids  High blood  pressure  Sleep Apnea     Review of Systems Derek Davies CMA; 03/11/2020 2:13 PM) General Present- Weight Gain. Not Present- Appetite Loss, Chills, Fatigue, Fever, Night Sweats and Weight Loss. Skin Not Present- Change in Wart/Mole, Dryness, Hives, Jaundice, New Lesions, Non-Healing Wounds, Rash and Ulcer. HEENT Present- Wears glasses/contact lenses. Not Present- Earache, Hearing Loss, Hoarseness, Nose Bleed, Oral Ulcers, Ringing in the Ears, Seasonal Allergies, Sinus Pain, Sore Throat, Visual Disturbances and Yellow Eyes. Respiratory Present- Snoring. Not Present- Bloody sputum, Chronic Cough, Difficulty Breathing and Wheezing. Cardiovascular Not Present- Chest Pain, Difficulty Breathing Lying Down, Leg Cramps, Palpitations, Rapid Heart Rate, Shortness of Breath and Swelling of Extremities. Gastrointestinal Present- Hemorrhoids and Indigestion. Not Present- Abdominal Pain, Bloating, Bloody Stool, Change in Bowel Habits, Chronic diarrhea, Constipation, Difficulty Swallowing, Excessive gas, Gets full quickly at meals, Nausea, Rectal Pain and Vomiting. Male Genitourinary Not Present- Blood in Urine, Change in Urinary Stream, Frequency, Impotence, Nocturia, Painful Urination, Urgency and Urine Leakage. Musculoskeletal Present- Back Pain and Joint Pain. Not Present- Joint Stiffness, Muscle Pain, Muscle Weakness and Swelling of Extremities. Neurological Not Present- Decreased Memory, Fainting, Headaches, Numbness, Seizures, Tingling, Tremor, Trouble walking and Weakness. Psychiatric Not Present- Anxiety, Bipolar, Change in Sleep Pattern, Depression, Fearful and Frequent crying. Endocrine Not Present- Cold Intolerance, Excessive Hunger, Hair Changes, Heat Intolerance, Hot flashes and New Diabetes. Hematology Not Present- Blood Thinners, Easy Bruising, Excessive bleeding, Gland problems, HIV and Persistent Infections.   Physical Exam Derek Neri E. Grandville Silos MD; 03/11/2020 2:15 PM) General Mental  Status-Alert. General Appearance-Consistent with stated age. Hydration-Well hydrated. Voice-Normal.  Head and Neck Head-normocephalic, atraumatic with no lesions or palpable masses. Trachea-midline.  Thyroid Gland Characteristics - normal size and consistency.  Eye Eyeball - Bilateral-Extraocular movements intact. Sclera/Conjunctiva - Bilateral-No scleral icterus.  Chest and Lung Exam Chest and lung exam reveals -quiet, even and easy respiratory effort with no use of accessory muscles and on auscultation, normal breath sounds, no adventitious sounds and normal vocal resonance. Inspection Chest Wall - Normal. Back - normal.  Cardiovascular Cardiovascular examination reveals -normal heart sounds, regular rate and rhythm with no murmurs and normal pedal pulses bilaterally.  Abdomen Inspection Inspection of the abdomen reveals - No Abnormal pulsations. Hernias - Diastasis recti - Present. Umbilical hernia - Reducible. Palpation/Percussion Palpation and Percussion of the abdomen reveal - Soft, Non Tender, No Rebound tenderness, No Rigidity (guarding) and No hepatosplenomegaly. Note: 4 Derek mass right upper quadrant, soft, subcutaneous, nontender. Auscultation Auscultation of the abdomen reveals - Bowel sounds normal.  Neurologic Neurologic evaluation reveals -alert and oriented x 3 with no impairment of recent or remote memory. Mental Status-Normal.  Musculoskeletal Global Assessment -Note: no gross deformities.  Normal Exam - Left-Upper Extremity Strength Normal and Lower Extremity Strength Normal. Normal Exam - Right-Upper Extremity Strength Normal and Lower Extremity Strength Normal.  Lymphatic Head & Neck  General Head & Neck Lymphatics: Bilateral - Description - Normal. Axillary  General Axillary Region: Bilateral - Description - Normal. Tenderness - Non Tender. Femoral & Inguinal  Generalized Femoral & Inguinal Lymphatics: Bilateral -  Description - No Generalized lymphadenopathy.    Assessment & Plan Derek Neri E. Grandville Silos MD; 3/54/5625 6:38 PM) UMBILICAL HERNIA WITHOUT OBSTRUCTION OR GANGRENE (K42.9) Impression: Operative umbilical hernia repair with mesh as an outpatient surgical procedure. I discussed the procedure, risks, and benefits with him in detail. I also discussed the expected postoperative course including no lifting over 10 pounds for 6 weeks. He is agreeable and would like to schedule. 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 I have offered removal at the same time as his hernia surgery. I discussed the procedure, risks, and benefits regarding this as well. He is agreeable.

## 2020-04-26 ENCOUNTER — Other Ambulatory Visit: Payer: Self-pay | Admitting: Family Medicine

## 2020-04-26 ENCOUNTER — Other Ambulatory Visit: Payer: Self-pay | Admitting: Family

## 2020-04-26 DIAGNOSIS — I10 Essential (primary) hypertension: Secondary | ICD-10-CM

## 2020-04-26 DIAGNOSIS — G4700 Insomnia, unspecified: Secondary | ICD-10-CM

## 2020-05-05 ENCOUNTER — Other Ambulatory Visit: Payer: Self-pay

## 2020-05-05 ENCOUNTER — Telehealth: Payer: Self-pay | Admitting: Family Medicine

## 2020-05-05 NOTE — Telephone Encounter (Signed)
What is the name of the medication? losartan-hydrochlorothiazide (HYZAAR) 100-25 MG tablet [[id number]]    Have you contacted your pharmacy to request a refill? Pt is needing a refill and he is completely out of his pills.   Which pharmacy would you like this sent to? Pharmacy  [facility], [address] [place], [place] [address]  Phone:  [phone] Fax:  [phone]       Patient notified that their request is being sent to the clinical staff for review and that they should receive a call once it is complete. If they do not receive a call within 72 hours they can check with their pharmacy or our office.

## 2020-05-06 ENCOUNTER — Other Ambulatory Visit: Payer: Self-pay

## 2020-05-06 DIAGNOSIS — I10 Essential (primary) hypertension: Secondary | ICD-10-CM

## 2020-05-06 MED ORDER — LOSARTAN POTASSIUM-HCTZ 100-25 MG PO TABS: 1.0000 | ORAL_TABLET | Freq: Every day | ORAL | 0 refills | Status: DC

## 2020-05-06 NOTE — Telephone Encounter (Signed)
Refill sent in

## 2020-09-12 ENCOUNTER — Other Ambulatory Visit: Payer: Self-pay | Admitting: Physician Assistant

## 2020-09-12 ENCOUNTER — Ambulatory Visit (INDEPENDENT_AMBULATORY_CARE_PROVIDER_SITE_OTHER): Payer: BC Managed Care – PPO

## 2020-09-12 ENCOUNTER — Other Ambulatory Visit: Payer: Self-pay

## 2020-09-12 DIAGNOSIS — R52 Pain, unspecified: Secondary | ICD-10-CM

## 2020-09-12 DIAGNOSIS — M7989 Other specified soft tissue disorders: Secondary | ICD-10-CM | POA: Diagnosis not present

## 2020-09-12 DIAGNOSIS — M25572 Pain in left ankle and joints of left foot: Secondary | ICD-10-CM

## 2020-09-12 DIAGNOSIS — M79672 Pain in left foot: Secondary | ICD-10-CM

## 2020-09-12 DIAGNOSIS — S99912A Unspecified injury of left ankle, initial encounter: Secondary | ICD-10-CM | POA: Diagnosis not present

## 2020-09-12 DIAGNOSIS — M7732 Calcaneal spur, left foot: Secondary | ICD-10-CM | POA: Diagnosis not present

## 2020-09-15 ENCOUNTER — Other Ambulatory Visit: Payer: Self-pay

## 2020-09-15 ENCOUNTER — Encounter: Payer: Self-pay | Admitting: Family Medicine

## 2020-09-15 ENCOUNTER — Ambulatory Visit (INDEPENDENT_AMBULATORY_CARE_PROVIDER_SITE_OTHER): Payer: BC Managed Care – PPO | Admitting: Family Medicine

## 2020-09-15 VITALS — BP 138/78 | HR 52 | Temp 97.1°F | Ht 72.0 in | Wt 240.6 lb

## 2020-09-15 DIAGNOSIS — N529 Male erectile dysfunction, unspecified: Secondary | ICD-10-CM

## 2020-09-15 DIAGNOSIS — Z0000 Encounter for general adult medical examination without abnormal findings: Secondary | ICD-10-CM | POA: Diagnosis not present

## 2020-09-15 DIAGNOSIS — R0982 Postnasal drip: Secondary | ICD-10-CM | POA: Diagnosis not present

## 2020-09-15 DIAGNOSIS — G4700 Insomnia, unspecified: Secondary | ICD-10-CM

## 2020-09-15 DIAGNOSIS — I10 Essential (primary) hypertension: Secondary | ICD-10-CM | POA: Diagnosis not present

## 2020-09-15 MED ORDER — TADALAFIL 20 MG PO TABS: 20.0000 mg | ORAL_TABLET | Freq: Every day | ORAL | 1 refills | Status: DC | PRN

## 2020-09-15 MED ORDER — LOSARTAN POTASSIUM-HCTZ 100-25 MG PO TABS: 1.0000 | ORAL_TABLET | Freq: Every day | ORAL | 0 refills | Status: DC

## 2020-09-15 MED ORDER — ESZOPICLONE 2 MG PO TABS: 2.0000 mg | ORAL_TABLET | Freq: Every evening | ORAL | 0 refills | Status: DC | PRN

## 2020-09-15 MED ORDER — AMLODIPINE BESYLATE 5 MG PO TABS: 5.0000 mg | ORAL_TABLET | Freq: Every day | ORAL | 1 refills | Status: DC

## 2020-09-15 MED ORDER — FLUTICASONE PROPIONATE 50 MCG/ACT NA SUSP: 2.0000 | Freq: Every day | NASAL | 6 refills | Status: DC

## 2020-09-15 NOTE — Progress Notes (Signed)
Established Patient Office Visit  Subjective:  Patient ID: Derek Davies, male    DOB: 03-14-1970  Age: 50 y.o. MRN: [id number]  CC:  Chief Complaint  Patient presents with   Follow-up    Refill/follow up on medications. No concerns.     HPI Echo Topp presents for follow-up of hypertension, insomnia, ED and health maintenance.  Family history of polyposis of the colon.  Status post colonoscopy over 10 years ago.  Uses Flonase successfully for postnasal drip and seasonal allergy symptoms.  Has been taking losartan 100/HCTZ 25 daily.  Blood pressure has been running in the upper 130s over upper 80s.  Unable to use his CPAP machine secondary to a severely deviated septum.  Scheduled for deviated septum surgery per ENT.  Hernia surgery is scheduled in November.  Rarely uses Ambien.  Sometimes makes him groggy the next day.  Does not seem to be helping much.  Quit tobacco but smokes cigars  Past Medical History:  Diagnosis Date   Hypertension     Past Surgical History:  Procedure Laterality Date   HAND SURGERY      Family History  Problem Relation Age of Onset   Heart disease Mother    Throat cancer Maternal Aunt    Throat cancer Maternal Uncle     Social History   Socioeconomic History   Marital status: Married    Spouse name: Not on file   Number of children: Not on file   Years of education: Not on file   Highest education level: Not on file  Occupational History   Not on file  Tobacco Use   Smoking status: Some Days    Packs/day: 1.00    Years: 3.00    Pack years: 3.00    Types: Cigarettes, Cigars   Smokeless tobacco: Never   Tobacco comments:    still smoke cigars once and a while/ no cigs.  Substance and Sexual Activity   Alcohol use: Not Currently    Comment: Recovering   Drug use: Not Currently   Sexual activity: Not on file  Other Topics Concern   Not on file  Social History Narrative   Not on file   Social Determinants of Health    Financial Resource Strain: Not on file  Food Insecurity: Not on file  Transportation Needs: Not on file  Physical Activity: Not on file  Stress: Not on file  Social Connections: Not on file  Intimate Partner Violence: Not on file    Outpatient Medications Prior to Visit  Medication Sig Dispense Refill   sodium chloride (OCEAN) 0.65 % SOLN nasal spray Place 2 sprays into both nostrils as needed for congestion. 88 mL 12   losartan-hydrochlorothiazide (HYZAAR) 100-25 MG tablet Take 1 tablet by mouth daily. 90 tablet 0   tadalafil (CIALIS) 20 MG tablet Take 1 tablet (20 mg total) by mouth daily as needed. 30 tablet 1   zolpidem (AMBIEN) 5 MG tablet TAKE 1 TABLET BY MOUTH AT BEDTIME AS NEEDED 30 tablet 0   fluticasone (FLONASE) 50 MCG/ACT nasal spray Place 2 sprays into both nostrils daily. (Patient not taking: Reported on 01/26/2020) 16 g 6   No facility-administered medications prior to visit.    No Known Allergies  ROS Review of Systems  Constitutional:  Negative for diaphoresis, fatigue, fever and unexpected weight change.  HENT:  Positive for congestion and postnasal drip.   Eyes:  Negative for photophobia.  Respiratory: Negative.    Cardiovascular: Negative.   Gastrointestinal:  Negative.   Genitourinary: Negative.   Musculoskeletal:  Positive for arthralgias.  Neurological:  Negative for speech difficulty and weakness.  Hematological:  Does not bruise/bleed easily.  Psychiatric/Behavioral:  Positive for sleep disturbance.      Objective:    Physical Exam Vitals and nursing note reviewed.  Constitutional:      General: He is not in acute distress.    Appearance: Normal appearance. He is obese. He is not ill-appearing, toxic-appearing or diaphoretic.  HENT:     Head: Normocephalic and atraumatic.     Right Ear: Tympanic membrane, ear canal and external ear normal.     Left Ear: Tympanic membrane, ear canal and external ear normal.     Mouth/Throat:     Mouth: Mucous  membranes are moist.     Pharynx: Oropharynx is clear. No oropharyngeal exudate or posterior oropharyngeal erythema.  Eyes:     Extraocular Movements: Extraocular movements intact.     Conjunctiva/sclera: Conjunctivae normal.     Pupils: Pupils are equal, round, and reactive to light.  Neck:     Vascular: No carotid bruit.  Cardiovascular:     Rate and Rhythm: Normal rate and regular rhythm.  Pulmonary:     Effort: Pulmonary effort is normal.     Breath sounds: Normal breath sounds.  Abdominal:     General: Abdomen is flat. Bowel sounds are normal. There is no distension.     Palpations: There is no mass.     Tenderness: There is no abdominal tenderness. There is no guarding or rebound.     Hernia: No hernia is present.  Musculoskeletal:     Cervical back: No rigidity or tenderness.     Right lower leg: No edema.     Left lower leg: No edema.  Lymphadenopathy:     Cervical: No cervical adenopathy.  Skin:    General: Skin is warm and dry.  Neurological:     Mental Status: He is alert and oriented to person, place, and time.  Psychiatric:        Behavior: Behavior normal.    BP 138/78 (BP Location: Right Arm, Patient Position: Sitting, Cuff Size: Normal)   Pulse (!) 52   Temp (!) 97.1 F (36.2 C) (Temporal)   Ht 6' (1.829 m)   Wt 240 lb 9.6 oz (109.1 kg)   SpO2 94%   BMI 32.63 kg/m  Wt Readings from Last 3 Encounters:  09/15/20 240 lb 9.6 oz (109.1 kg)  01/26/20 240 lb 12.8 oz (109.2 kg)  05/28/19 243 lb (110.2 kg)     Health Maintenance Due  Topic Date Due   Pneumococcal Vaccine 50-52 Years old (1 - PCV) Never done   HIV Screening  Never done   Hepatitis C Screening  Never done   TETANUS/TDAP  Never done   COLONOSCOPY (Pts 45-70yr Insurance coverage will need to be confirmed)  Never done   Zoster Vaccines- Shingrix (1 of 2) Never done    There are no preventive care reminders to display for this patient.  No results found for: TSH Lab Results  Component  Value Date   WBC 6.9 01/26/2020   HGB 15.5 01/26/2020   HCT 44.9 01/26/2020   MCV 92.3 01/26/2020   PLT 231.0 01/26/2020   Lab Results  Component Value Date   NA 143 01/26/2020   K 3.3 (L) 01/26/2020   CO2 29 01/26/2020   GLUCOSE 94 01/26/2020   BUN 9 01/26/2020   CREATININE 1.12 01/26/2020  BILITOT 0.6 01/26/2020   ALKPHOS 63 01/26/2020   AST 19 01/26/2020   ALT 25 01/26/2020   PROT 7.3 01/26/2020   ALBUMIN 4.6 01/26/2020   CALCIUM 10.2 01/26/2020   GFR 76.72 01/26/2020   Lab Results  Component Value Date   CHOL 170 09/11/2017   Lab Results  Component Value Date   HDL 44.60 09/11/2017   Lab Results  Component Value Date   LDLCALC 100 (H) 09/11/2017   Lab Results  Component Value Date   TRIG 126.0 09/11/2017   Lab Results  Component Value Date   CHOLHDL 4 09/11/2017   No results found for: HGBA1C    Assessment & Plan:   Problem List Items Addressed This Visit       Cardiovascular and Mediastinum   Essential hypertension   Relevant Medications   losartan-hydrochlorothiazide (HYZAAR) 100-25 MG tablet   tadalafil (CIALIS) 20 MG tablet   amLODipine (NORVASC) 5 MG tablet   Other Relevant Orders   CBC   Comprehensive metabolic panel     Other   Erectile dysfunction   Relevant Medications   tadalafil (CIALIS) 20 MG tablet   Insomnia   Relevant Medications   eszopiclone (LUNESTA) 2 MG TABS tablet   Post-nasal drip   Relevant Medications   fluticasone (FLONASE) 50 MCG/ACT nasal spray   Other Visit Diagnoses     Healthcare maintenance    -  Primary   Relevant Orders   Ambulatory referral to Gastroenterology       Meds ordered this encounter  Medications   fluticasone (FLONASE) 50 MCG/ACT nasal spray    Sig: Place 2 sprays into both nostrils daily.    Dispense:  16 g    Refill:  6   losartan-hydrochlorothiazide (HYZAAR) 100-25 MG tablet    Sig: Take 1 tablet by mouth daily.    Dispense:  90 tablet    Refill:  0   tadalafil (CIALIS) 20  MG tablet    Sig: Take 1 tablet (20 mg total) by mouth daily as needed.    Dispense:  30 tablet    Refill:  1   eszopiclone (LUNESTA) 2 MG TABS tablet    Sig: Take 1 tablet (2 mg total) by mouth at bedtime as needed for sleep. Take immediately before bedtime    Dispense:  30 tablet    Refill:  0   amLODipine (NORVASC) 5 MG tablet    Sig: Take 1 tablet (5 mg total) by mouth daily.    Dispense:  90 tablet    Refill:  1    Follow-up: Return in about 3 months (around 12/15/2020), or Try to lose some weight and follow-up for blood pressure and physical in 3 months..  I have added amlodipine 5 mg.  Advised that weight loss would help his blood pressure a great deal.  He was given information on managing his hypertension as well as information on insomnia and Lunesta.  Libby Maw, MD

## 2020-09-16 LAB — COMPREHENSIVE METABOLIC PANEL
ALT: 19 U/L (ref 0–53)
AST: 14 U/L (ref 0–37)
Albumin: 4.4 g/dL (ref 3.5–5.2)
Alkaline Phosphatase: 58 U/L (ref 39–117)
BUN: 11 mg/dL (ref 6–23)
CO2: 27 mEq/L (ref 19–32)
Calcium: 9.6 mg/dL (ref 8.4–10.5)
Chloride: 105 mEq/L (ref 96–112)
Creatinine, Ser: 1.08 mg/dL (ref 0.40–1.50)
GFR: 79.78 mL/min (ref >60.00)
Glucose, Bld: 84 mg/dL (ref 70–99)
Potassium: 3.9 mEq/L (ref 3.5–5.1)
Sodium: 142 mEq/L (ref 135–145)
Total Bilirubin: 0.4 mg/dL (ref 0.2–1.2)
Total Protein: 6.8 g/dL (ref 6.0–8.3)

## 2020-09-16 LAB — CBC
HCT: 45.1 % (ref 39.0–52.0)
Hemoglobin: 15.5 g/dL (ref 13.0–17.0)
MCHC: 34.4 g/dL (ref 30.0–36.0)
MCV: 94.2 fl (ref 78.0–100.0)
Platelets: 219 10*3/uL (ref 150.0–400.0)
RBC: 4.79 Mil/uL (ref 4.22–5.81)
RDW: 12.9 % (ref 11.5–15.5)
WBC: 6.6 10*3/uL (ref 4.0–10.5)

## 2020-10-12 ENCOUNTER — Ambulatory Visit: Payer: BC Managed Care – PPO | Admitting: Family Medicine

## 2020-10-20 ENCOUNTER — Other Ambulatory Visit: Payer: Self-pay

## 2020-10-20 ENCOUNTER — Ambulatory Visit: Payer: BC Managed Care – PPO | Admitting: Family Medicine

## 2020-10-20 ENCOUNTER — Encounter: Payer: Self-pay | Admitting: Family Medicine

## 2020-10-20 VITALS — BP 124/80 | HR 62 | Temp 97.2°F | Ht 72.0 in | Wt 236.0 lb

## 2020-10-20 DIAGNOSIS — Z7689 Persons encountering health services in other specified circumstances: Secondary | ICD-10-CM | POA: Diagnosis not present

## 2020-10-20 DIAGNOSIS — Z23 Encounter for immunization: Secondary | ICD-10-CM | POA: Diagnosis not present

## 2020-10-20 DIAGNOSIS — Q667 Congenital pes cavus, unspecified foot: Secondary | ICD-10-CM

## 2020-10-20 DIAGNOSIS — I10 Essential (primary) hypertension: Secondary | ICD-10-CM

## 2020-10-20 NOTE — Progress Notes (Signed)
Established Patient Office Visit  Subjective:  Patient ID: Derek Davies, male    DOB: 1970/05/08  Age: 50 y.o. MRN: [id number]  CC:  Chief Complaint  Patient presents with   Follow-up    Follow up on sprained ankle need to be released from work restrictions.     HPI Derek Davies presents for follow-up on hypertension that is improved with the addition of amlodipine.  Tolerating the drug well.  Also here for return to work clearance.  He sprained his left ankle 2 months ago and it has since healed.  He is no longer having pain with ambulation.  This is been oblique ankle for him that goes out from time to time.  He has decided to wear an ankle brace while he is at work.  Past Medical History:  Diagnosis Date   Hypertension     Past Surgical History:  Procedure Laterality Date   HAND SURGERY      Family History  Problem Relation Age of Onset   Heart disease Mother    Throat cancer Maternal Aunt    Throat cancer Maternal Uncle     Social History   Socioeconomic History   Marital status: Married    Spouse name: Not on file   Number of children: Not on file   Years of education: Not on file   Highest education level: Not on file  Occupational History   Not on file  Tobacco Use   Smoking status: Some Days    Types: Cigars   Smokeless tobacco: Former   Tobacco comments:    still smoke cigars once and a while/ no cigs.  Substance and Sexual Activity   Alcohol use: Yes    Comment: No more than 3 in a given setting.   Drug use: Not Currently   Sexual activity: Not on file  Other Topics Concern   Not on file  Social History Narrative   Not on file   Social Determinants of Health   Financial Resource Strain: Not on file  Food Insecurity: Not on file  Transportation Needs: Not on file  Physical Activity: Not on file  Stress: Not on file  Social Connections: Not on file  Intimate Partner Violence: Not on file    Outpatient Medications Prior to Visit   Medication Sig Dispense Refill   amLODipine (NORVASC) 5 MG tablet Take 1 tablet (5 mg total) by mouth daily. 90 tablet 1   eszopiclone (LUNESTA) 2 MG TABS tablet Take 1 tablet (2 mg total) by mouth at bedtime as needed for sleep. Take immediately before bedtime 30 tablet 0   fluticasone (FLONASE) 50 MCG/ACT nasal spray Place 2 sprays into both nostrils daily. 16 g 6   losartan-hydrochlorothiazide (HYZAAR) 100-25 MG tablet Take 1 tablet by mouth daily. 90 tablet 0   sodium chloride (OCEAN) 0.65 % SOLN nasal spray Place 2 sprays into both nostrils as needed for congestion. 88 mL 12   tadalafil (CIALIS) 20 MG tablet Take 1 tablet (20 mg total) by mouth daily as needed. 30 tablet 1   No facility-administered medications prior to visit.    No Known Allergies  ROS Review of Systems  Constitutional: Negative.   Respiratory: Negative.    Cardiovascular: Negative.   Gastrointestinal: Negative.   Musculoskeletal:  Negative for arthralgias, gait problem and joint swelling.     Objective:    Physical Exam Vitals and nursing note reviewed.  Constitutional:      General: He is not in  acute distress.    Appearance: Normal appearance. He is not ill-appearing, toxic-appearing or diaphoretic.  HENT:     Head: Normocephalic and atraumatic.  Pulmonary:     Effort: Pulmonary effort is normal.  Musculoskeletal:     Left ankle: No swelling. No tenderness. Normal range of motion. Anterior drawer test negative. Normal pulse.       Legs:  Neurological:     Mental Status: He is alert and oriented to person, place, and time.  Psychiatric:        Mood and Affect: Mood normal.        Behavior: Behavior normal.    BP 124/80 (BP Location: Right Arm, Patient Position: Sitting, Cuff Size: Normal)   Pulse 62   Temp (!) 97.2 F (36.2 C) (Temporal)   Ht 6' (1.829 m)   Wt 236 lb (107 kg)   SpO2 94%   BMI 32.01 kg/m  Wt Readings from Last 3 Encounters:  10/20/20 236 lb (107 kg)  09/15/20 240 lb 9.6  oz (109.1 kg)  01/26/20 240 lb 12.8 oz (109.2 kg)     Health Maintenance Due  Topic Date Due   Pneumococcal Vaccine 7-32 Years old (1 - PCV) Never done   HIV Screening  Never done   Hepatitis C Screening  Never done   TETANUS/TDAP  Never done   COLONOSCOPY (Pts 45-32yrs Insurance coverage will need to be confirmed)  Never done   Zoster Vaccines- Shingrix (1 of 2) Never done    There are no preventive care reminders to display for this patient.  No results found for: TSH Lab Results  Component Value Date   WBC 6.6 09/15/2020   HGB 15.5 09/15/2020   HCT 45.1 09/15/2020   MCV 94.2 09/15/2020   PLT 219.0 09/15/2020   Lab Results  Component Value Date   NA 142 09/15/2020   K 3.9 09/15/2020   CO2 27 09/15/2020   GLUCOSE 84 09/15/2020   BUN 11 09/15/2020   CREATININE 1.08 09/15/2020   BILITOT 0.4 09/15/2020   ALKPHOS 58 09/15/2020   AST 14 09/15/2020   ALT 19 09/15/2020   PROT 6.8 09/15/2020   ALBUMIN 4.4 09/15/2020   CALCIUM 9.6 09/15/2020   GFR 79.78 09/15/2020   Lab Results  Component Value Date   CHOL 170 09/11/2017   Lab Results  Component Value Date   HDL 44.60 09/11/2017   Lab Results  Component Value Date   LDLCALC 100 (H) 09/11/2017   Lab Results  Component Value Date   TRIG 126.0 09/11/2017   Lab Results  Component Value Date   CHOLHDL 4 09/11/2017   No results found for: HGBA1C    Assessment & Plan:   Problem List Items Addressed This Visit       Cardiovascular and Mediastinum   Essential hypertension     Other   Return to work evaluation   Need for influenza vaccination - Primary   Relevant Orders   Flu Vaccine QUAD 6+ mos PF IM (Fluarix Quad PF) (Completed)   Congenital cavus foot    No orders of the defined types were placed in this encounter.   Follow-up: Return Return for scheduled physical after hernia surgery..   Continue Hyzaar and amlodipine for blood pressure.  Will wear brace while at work.  Discussed some of the  risks after recurrent ankle sprains.  Also discussed his cavus foot.  He does use shoe inserts that supported.  Completed form returned to work. Mortimer Fries  Ethelene Hal, MD

## 2020-11-01 NOTE — Pre-Procedure Instructions (Signed)
Surgical Instructions    Your procedure is scheduled on Tuesday 11/08/20.   Report to [facility] Main Entrance "A" at 05:30 A.M., then check in with the Admitting office.  Call this number if you have problems the morning of surgery:  [phone]   If you have any questions prior to your surgery date call [phone]: Open Monday-Friday 8am-4pm    Remember:  Do not eat after midnight the night before your surgery  You may drink clear liquids until 04:30 A.M. the morning of your surgery.   Clear liquids allowed are: Water, Non-Citrus Juices (without pulp), Carbonated Beverages, Clear Tea, Black Coffee ONLY (NO MILK, CREAM OR POWDERED CREAMER of any kind), and Gatorade    Take these medicines the morning of surgery with A SIP OF WATER   amLODipine (NORVASC)  omeprazole (PRILOSEC OTC)   Take these medicines if needed:   fluticasone (FLONASE)    As of today, STOP taking any Aspirin (unless otherwise instructed by your surgeon) Aleve, Naproxen, Ibuprofen, Motrin, Advil, Goody's, BC's, all herbal medications, fish oil, and all vitamins.     After your COVID test   You are not required to quarantine however you are required to wear a well-fitting mask when you are out and around people not in your household.  If your mask becomes wet or soiled, replace with a new one.  Wash your hands often with soap and water for 20 seconds or clean your hands with an alcohol-based hand sanitizer that contains at least 60% alcohol.  Do not share personal items.  Notify your provider: if you are in close contact with someone who has COVID  or if you develop a fever of 100.4 or greater, sneezing, cough, sore throat, shortness of breath or body aches.             Do not wear jewelry or makeup Do not wear lotions, powders, perfumes/colognes, or deodorant. Do not shave 48 hours prior to surgery.  Men may shave face and neck. Do not bring valuables to the hospital. DO Not wear nail polish, gel  polish, artificial nails, or any other type of covering on natural nails including finger and toenails. If patients have artificial nails, gel coating, etc. that need to be removed by a nail salon, please have this removed prior to surgery or surgery may need to be canceled/delayed if the surgeon/ anesthesia feels like the patient is unable to be adequately monitored.             [facility] is not responsible for any belongings or valuables.  Do NOT Smoke (Tobacco/Vaping)  24 hours prior to your procedure  If you use a CPAP at night, you may bring your mask for your overnight stay.   Contacts, glasses, hearing aids, dentures or partials may not be worn into surgery, please bring cases for these belongings   For patients admitted to the hospital, discharge time will be determined by your treatment team.   Patients discharged the day of surgery will not be allowed to drive home, and someone needs to stay with them for 24 hours.  NO VISITORS WILL BE ALLOWED IN PRE-OP WHERE PATIENTS ARE PREPPED FOR SURGERY.  ONLY 1 SUPPORT PERSON MAY BE PRESENT IN THE WAITING ROOM WHILE YOU ARE IN SURGERY.  IF YOU ARE TO BE ADMITTED, ONCE YOU ARE IN YOUR ROOM YOU WILL BE ALLOWED TWO (2) VISITORS. 1 (ONE) VISITOR MAY STAY OVERNIGHT BUT MUST ARRIVE TO THE ROOM BY 8pm.  Minor  children may have two parents present. Special consideration for safety and communication needs will be reviewed on a case by case basis.  Special instructions:    Oral Hygiene is also important to reduce your risk of infection.  Remember - BRUSH YOUR TEETH THE MORNING OF SURGERY WITH YOUR REGULAR TOOTHPASTE   [facility]- Preparing For Surgery  Before surgery, you can play an important role. Because skin is not sterile, your skin needs to be as free of germs as possible. You can reduce the number of germs on your skin by washing with CHG (chlorahexidine gluconate) Soap before surgery.  CHG is an antiseptic cleaner which kills germs and bonds  with the skin to continue killing germs even after washing.     Please do not use if you have an allergy to CHG or antibacterial soaps. If your skin becomes reddened/irritated stop using the CHG.  Do not shave (including legs and underarms) for at least 48 hours prior to first CHG shower. It is OK to shave your face.  Please follow these instructions carefully.     Shower the NIGHT BEFORE SURGERY and the MORNING OF SURGERY with CHG Soap.   If you chose to wash your hair, wash your hair first as usual with your normal shampoo. After you shampoo, rinse your hair and body thoroughly to remove the shampoo.  Then [employer] and genitals (private parts) with your normal soap and rinse thoroughly to remove soap.  After that Use CHG Soap as you would any other liquid soap. You can apply CHG directly to the skin and wash gently with a scrungie or a clean washcloth.   Apply the CHG Soap to your body ONLY FROM THE NECK DOWN.  Do not use on open wounds or open sores. Avoid contact with your eyes, ears, mouth and genitals (private parts). Wash Face and genitals (private parts)  with your normal soap.   Wash thoroughly, paying special attention to the area where your surgery will be performed.  Thoroughly rinse your body with warm water from the neck down.  DO NOT shower/wash with your normal soap after using and rinsing off the CHG Soap.  Pat yourself dry with a CLEAN TOWEL.  Wear CLEAN PAJAMAS to bed the night before surgery  Place CLEAN SHEETS on your bed the night before your surgery  DO NOT SLEEP WITH PETS.   Day of Surgery:  Take a shower with CHG soap. Wear Clean/Comfortable clothing the morning of surgery Do not apply any deodorants/lotions.   Remember to brush your teeth WITH YOUR REGULAR TOOTHPASTE.   Please read over the following fact sheets that you were given.

## 2020-11-02 ENCOUNTER — Encounter (HOSPITAL_COMMUNITY)
Admission: RE | Admit: 2020-11-02 | Discharge: 2020-11-02 | Disposition: A | Discharge status: Home / Self Care | Payer: BC Managed Care – PPO | Source: Ambulatory Visit | Attending: General Surgery | Admitting: General Surgery

## 2020-11-02 ENCOUNTER — Encounter (HOSPITAL_COMMUNITY): Payer: Self-pay

## 2020-11-02 ENCOUNTER — Other Ambulatory Visit: Payer: Self-pay

## 2020-11-02 VITALS — BP 127/92 | HR 60 | Temp 98.4°F | Resp 19 | Ht 71.0 in | Wt 236.0 lb

## 2020-11-02 DIAGNOSIS — Z01818 Encounter for other preprocedural examination: Secondary | ICD-10-CM | POA: Diagnosis not present

## 2020-11-02 DIAGNOSIS — I2510 Atherosclerotic heart disease of native coronary artery without angina pectoris: Secondary | ICD-10-CM | POA: Insufficient documentation

## 2020-11-02 HISTORY — DX: Gastro-esophageal reflux disease without esophagitis: K21.9

## 2020-11-02 HISTORY — DX: Sleep apnea, unspecified: G47.30

## 2020-11-02 LAB — CBC
HCT: 46.5 % (ref 39.0–52.0)
Hemoglobin: 16.2 g/dL (ref 13.0–17.0)
MCH: 32.1 pg (ref 26.0–34.0)
MCHC: 34.8 g/dL (ref 30.0–36.0)
MCV: 92.1 fL (ref 80.0–100.0)
Platelets: 236 10*3/uL (ref 150–400)
RBC: 5.05 MIL/uL (ref 4.22–5.81)
RDW: 12.1 % (ref 11.5–15.5)
WBC: 7.4 10*3/uL (ref 4.0–10.5)
nRBC: 0 % (ref 0.0–0.2)

## 2020-11-02 LAB — BASIC METABOLIC PANEL
Anion gap: 8 (ref 5–15)
BUN: 8 mg/dL (ref 6–20)
CO2: 27 mmol/L (ref 22–32)
Calcium: 9.2 mg/dL (ref 8.9–10.3)
Chloride: 104 mmol/L (ref 98–111)
Creatinine, Ser: 1.03 mg/dL (ref 0.61–1.24)
GFR, Estimated: >60 mL/min (ref >60)
Glucose, Bld: 85 mg/dL (ref 70–99)
Potassium: 3.3 mmol/L — ABNORMAL LOW (ref 3.5–5.1)
Sodium: 139 mmol/L (ref 135–145)

## 2020-11-02 NOTE — Progress Notes (Addendum)
PCP - Dr. Abelino Derrick Cardiologist - denies  PPM/ICD - n/a  Chest x-ray - n/a EKG - 11/02/20 Stress Test - denies ECHO - denies Cardiac Cath - denies  Sleep Study - Yes. +OSA .  CPAP - Does not use due to nasal obstruction per patient.   Fasting Blood Sugar - n/a Checks Blood Sugar _____ times a day- n/a  Blood Thinner Instructions: n/a Aspirin Instructions: n/a  ERAS Protcol - Yes PRE-SURGERY Ensure or G2- No  COVID TEST- No. Ambulatory Surgery   Anesthesia review: Yes. EKG review.   Patient denies shortness of breath, fever, cough and chest pain at PAT appointment   All instructions explained to the patient, with a verbal understanding of the material. Patient agrees to go over the instructions while at home for a better understanding. Patient also instructed to self quarantine after being tested for COVID-19. The opportunity to ask questions was provided.

## 2020-11-08 ENCOUNTER — Ambulatory Visit (HOSPITAL_COMMUNITY)
Admission: RE | Admit: 2020-11-08 | Discharge: 2020-11-08 | Disposition: A | Discharge status: Home / Self Care | Payer: BC Managed Care – PPO | Attending: General Surgery | Admitting: General Surgery

## 2020-11-08 ENCOUNTER — Other Ambulatory Visit: Payer: Self-pay

## 2020-11-08 ENCOUNTER — Ambulatory Visit (HOSPITAL_COMMUNITY): Payer: BC Managed Care – PPO | Admitting: Certified Registered"

## 2020-11-08 ENCOUNTER — Encounter (HOSPITAL_COMMUNITY)
Admission: RE | Disposition: A | Discharge status: Home / Self Care | Payer: Self-pay | Source: Home / Self Care | Attending: General Surgery

## 2020-11-08 ENCOUNTER — Ambulatory Visit (HOSPITAL_COMMUNITY): Payer: BC Managed Care – PPO | Admitting: Physician Assistant

## 2020-11-08 ENCOUNTER — Encounter (HOSPITAL_COMMUNITY): Payer: Self-pay | Admitting: General Surgery

## 2020-11-08 DIAGNOSIS — F17290 Nicotine dependence, other tobacco product, uncomplicated: Secondary | ICD-10-CM | POA: Diagnosis not present

## 2020-11-08 DIAGNOSIS — D179 Benign lipomatous neoplasm, unspecified: Secondary | ICD-10-CM | POA: Diagnosis not present

## 2020-11-08 DIAGNOSIS — Z79899 Other long term (current) drug therapy: Secondary | ICD-10-CM | POA: Diagnosis not present

## 2020-11-08 DIAGNOSIS — I10 Essential (primary) hypertension: Secondary | ICD-10-CM | POA: Diagnosis not present

## 2020-11-08 DIAGNOSIS — K429 Umbilical hernia without obstruction or gangrene: Secondary | ICD-10-CM | POA: Insufficient documentation

## 2020-11-08 DIAGNOSIS — D175 Benign lipomatous neoplasm of intra-abdominal organs: Secondary | ICD-10-CM | POA: Diagnosis not present

## 2020-11-08 DIAGNOSIS — D171 Benign lipomatous neoplasm of skin and subcutaneous tissue of trunk: Secondary | ICD-10-CM | POA: Diagnosis not present

## 2020-11-08 DIAGNOSIS — K219 Gastro-esophageal reflux disease without esophagitis: Secondary | ICD-10-CM | POA: Diagnosis not present

## 2020-11-08 DIAGNOSIS — G4730 Sleep apnea, unspecified: Secondary | ICD-10-CM | POA: Diagnosis not present

## 2020-11-08 HISTORY — PX: LIPOMA EXCISION: SHX5283

## 2020-11-08 HISTORY — PX: UMBILICAL HERNIA REPAIR: SHX196

## 2020-11-08 SURGERY — REPAIR, HERNIA, UMBILICAL, ADULT
Anesthesia: General | Laterality: Right

## 2020-11-08 MED ORDER — BUPIVACAINE-EPINEPHRINE 0.5% -1:200000 IJ SOLN
INTRAMUSCULAR | Status: AC
  Filled 2020-11-08: qty 1

## 2020-11-08 MED ORDER — ACETAMINOPHEN 500 MG PO TABS: 1000.0000 mg | ORAL_TABLET | Freq: Once | ORAL | Status: DC | PRN

## 2020-11-08 MED ORDER — OXYCODONE HCL 5 MG/5ML PO SOLN: 5.0000 mg | Freq: Once | ORAL | Status: DC | PRN

## 2020-11-08 MED ORDER — PHENYLEPHRINE 40 MCG/ML (10ML) SYRINGE FOR IV PUSH (FOR BLOOD PRESSURE SUPPORT)
PREFILLED_SYRINGE | INTRAVENOUS | Status: DC | PRN
  Administered 2020-11-08: 80 ug via INTRAVENOUS
  Administered 2020-11-08: 120 ug via INTRAVENOUS

## 2020-11-08 MED ORDER — FENTANYL CITRATE (PF) 100 MCG/2ML IJ SOLN: 25.0000 ug | INTRAMUSCULAR | Status: DC | PRN

## 2020-11-08 MED ORDER — ACETAMINOPHEN 10 MG/ML IV SOLN
INTRAVENOUS | Status: DC | PRN
  Administered 2020-11-08: 1000 mg via INTRAVENOUS

## 2020-11-08 MED ORDER — LIDOCAINE 2% (20 MG/ML) 5 ML SYRINGE
INTRAMUSCULAR | Status: AC
  Filled 2020-11-08: qty 5

## 2020-11-08 MED ORDER — OXYCODONE HCL 5 MG PO TABS: 5.0000 mg | ORAL_TABLET | Freq: Once | ORAL | Status: DC | PRN

## 2020-11-08 MED ORDER — DEXAMETHASONE SODIUM PHOSPHATE 10 MG/ML IJ SOLN
INTRAMUSCULAR | Status: DC | PRN
  Administered 2020-11-08: 5 mg via INTRAVENOUS

## 2020-11-08 MED ORDER — CHLORHEXIDINE GLUCONATE 0.12 % MT SOLN
15.0000 mL | Freq: Once | OROMUCOSAL | Status: AC
  Administered 2020-11-08: 15 mL via OROMUCOSAL
  Filled 2020-11-08: qty 15

## 2020-11-08 MED ORDER — FENTANYL CITRATE (PF) 250 MCG/5ML IJ SOLN
INTRAMUSCULAR | Status: AC
  Filled 2020-11-08: qty 5

## 2020-11-08 MED ORDER — ACETAMINOPHEN 10 MG/ML IV SOLN: 1000.0000 mg | Freq: Once | INTRAVENOUS | Status: DC | PRN

## 2020-11-08 MED ORDER — PROPOFOL 10 MG/ML IV BOLUS
INTRAVENOUS | Status: DC | PRN
  Administered 2020-11-08: 160 mg via INTRAVENOUS

## 2020-11-08 MED ORDER — LACTATED RINGERS IV SOLN: INTRAVENOUS | Status: DC

## 2020-11-08 MED ORDER — EPHEDRINE 5 MG/ML INJ
INTRAVENOUS | Status: AC
  Filled 2020-11-08: qty 5

## 2020-11-08 MED ORDER — PROPOFOL 10 MG/ML IV BOLUS
INTRAVENOUS | Status: AC
  Filled 2020-11-08: qty 40

## 2020-11-08 MED ORDER — LIDOCAINE 2% (20 MG/ML) 5 ML SYRINGE
INTRAMUSCULAR | Status: DC | PRN
  Administered 2020-11-08: 60 mg via INTRAVENOUS

## 2020-11-08 MED ORDER — ORAL CARE MOUTH RINSE: 15.0000 mL | Freq: Once | OROMUCOSAL | Status: AC

## 2020-11-08 MED ORDER — SUGAMMADEX SODIUM 200 MG/2ML IV SOLN
INTRAVENOUS | Status: DC | PRN
  Administered 2020-11-08: 200 mg via INTRAVENOUS

## 2020-11-08 MED ORDER — ONDANSETRON HCL 4 MG/2ML IJ SOLN
INTRAMUSCULAR | Status: DC | PRN
  Administered 2020-11-08: 4 mg via INTRAVENOUS

## 2020-11-08 MED ORDER — BUPIVACAINE-EPINEPHRINE 0.5% -1:200000 IJ SOLN
INTRAMUSCULAR | Status: DC | PRN
  Administered 2020-11-08: 16 mL
  Administered 2020-11-08: 10 mL

## 2020-11-08 MED ORDER — ROCURONIUM BROMIDE 10 MG/ML (PF) SYRINGE
PREFILLED_SYRINGE | INTRAVENOUS | Status: AC
  Filled 2020-11-08: qty 10

## 2020-11-08 MED ORDER — FENTANYL CITRATE (PF) 250 MCG/5ML IJ SOLN
INTRAMUSCULAR | Status: DC | PRN
  Administered 2020-11-08: 150 ug via INTRAVENOUS

## 2020-11-08 MED ORDER — 0.9 % SODIUM CHLORIDE (POUR BTL) OPTIME
TOPICAL | Status: DC | PRN
  Administered 2020-11-08: 1000 mL

## 2020-11-08 MED ORDER — ROCURONIUM BROMIDE 10 MG/ML (PF) SYRINGE
PREFILLED_SYRINGE | INTRAVENOUS | Status: DC | PRN
  Administered 2020-11-08: 80 mg via INTRAVENOUS

## 2020-11-08 MED ORDER — MIDAZOLAM HCL 2 MG/2ML IJ SOLN
INTRAMUSCULAR | Status: DC | PRN
  Administered 2020-11-08: 2 mg via INTRAVENOUS

## 2020-11-08 MED ORDER — CEFAZOLIN SODIUM-DEXTROSE 2-3 GM-%(50ML) IV SOLR
INTRAVENOUS | Status: DC | PRN
  Administered 2020-11-08: 2 g via INTRAVENOUS

## 2020-11-08 MED ORDER — ACETAMINOPHEN 160 MG/5ML PO SOLN: 1000.0000 mg | Freq: Once | ORAL | Status: DC | PRN

## 2020-11-08 MED ORDER — ACETAMINOPHEN 10 MG/ML IV SOLN
INTRAVENOUS | Status: AC
  Filled 2020-11-08: qty 100

## 2020-11-08 MED ORDER — EPHEDRINE SULFATE-NACL 50-0.9 MG/10ML-% IV SOSY
PREFILLED_SYRINGE | INTRAVENOUS | Status: DC | PRN
  Administered 2020-11-08: 5 mg via INTRAVENOUS

## 2020-11-08 MED ORDER — MIDAZOLAM HCL 2 MG/2ML IJ SOLN
INTRAMUSCULAR | Status: AC
  Filled 2020-11-08: qty 2

## 2020-11-08 MED ORDER — ONDANSETRON HCL 4 MG/2ML IJ SOLN
INTRAMUSCULAR | Status: AC
  Filled 2020-11-08: qty 2

## 2020-11-08 MED ORDER — DEXAMETHASONE SODIUM PHOSPHATE 10 MG/ML IJ SOLN
INTRAMUSCULAR | Status: AC
  Filled 2020-11-08: qty 1

## 2020-11-08 MED ORDER — OXYCODONE HCL 5 MG PO TABS: 5.0000 mg | ORAL_TABLET | Freq: Four times a day (QID) | ORAL | 0 refills | Status: DC | PRN

## 2020-11-08 MED ORDER — KETOROLAC TROMETHAMINE 30 MG/ML IJ SOLN
INTRAMUSCULAR | Status: DC | PRN
  Administered 2020-11-08: 30 mg via INTRAVENOUS

## 2020-11-08 MED ORDER — PHENYLEPHRINE 40 MCG/ML (10ML) SYRINGE FOR IV PUSH (FOR BLOOD PRESSURE SUPPORT)
PREFILLED_SYRINGE | INTRAVENOUS | Status: AC
  Filled 2020-11-08: qty 10

## 2020-11-08 SURGICAL SUPPLY — 40 items
ADH SKN CLS APL DERMABOND .7 (GAUZE/BANDAGES/DRESSINGS) ×2
APL PRP STRL LF DISP 70% ISPRP (MISCELLANEOUS) ×2
BAG COUNTER SPONGE SURGICOUNT (BAG) ×3 IMPLANT
BAG SPNG CNTER NS LX DISP (BAG) ×2
BLADE CLIPPER SURG (BLADE) ×1 IMPLANT
CANISTER SUCT 3000ML PPV (MISCELLANEOUS) ×3 IMPLANT
CHLORAPREP W/TINT 26 (MISCELLANEOUS) ×3 IMPLANT
COVER SURGICAL LIGHT HANDLE (MISCELLANEOUS) ×3 IMPLANT
DERMABOND ADVANCED (GAUZE/BANDAGES/DRESSINGS) ×1
DERMABOND ADVANCED .7 DNX12 (GAUZE/BANDAGES/DRESSINGS) ×2 IMPLANT
DRAPE LAPAROSCOPIC ABDOMINAL (DRAPES) ×1 IMPLANT
DRAPE LAPAROTOMY 100X72 PEDS (DRAPES) ×3 IMPLANT
ELECT REM PT RETURN 9FT ADLT (ELECTROSURGICAL) ×3
ELECTRODE REM PT RTRN 9FT ADLT (ELECTROSURGICAL) ×2 IMPLANT
GLOVE SRG 8 PF TXTR STRL LF DI (GLOVE) ×2 IMPLANT
GLOVE SURG ENC MOIS LTX SZ8 (GLOVE) ×3 IMPLANT
GLOVE SURG UNDER POLY LF SZ8 (GLOVE) ×3
GOWN STRL REUS W/ TWL LRG LVL3 (GOWN DISPOSABLE) ×2 IMPLANT
GOWN STRL REUS W/ TWL XL LVL3 (GOWN DISPOSABLE) ×2 IMPLANT
GOWN STRL REUS W/TWL LRG LVL3 (GOWN DISPOSABLE) ×3
GOWN STRL REUS W/TWL XL LVL3 (GOWN DISPOSABLE) ×3
KIT BASIN OR (CUSTOM PROCEDURE TRAY) ×3 IMPLANT
KIT TURNOVER KIT B (KITS) ×3 IMPLANT
MESH VENTRALEX ST 1-7/10 CRC S (Mesh General) ×1 IMPLANT
NEEDLE 22X1 1/2 (OR ONLY) (NEEDLE) ×3 IMPLANT
NS IRRIG 1000ML POUR BTL (IV SOLUTION) ×3 IMPLANT
PACK GENERAL/GYN (CUSTOM PROCEDURE TRAY) ×3 IMPLANT
PAD ARMBOARD 7.5X6 YLW CONV (MISCELLANEOUS) ×6 IMPLANT
PENCIL SMOKE EVACUATOR (MISCELLANEOUS) ×3 IMPLANT
SPECIMEN JAR MEDIUM (MISCELLANEOUS) ×3 IMPLANT
SUT MNCRL AB 4-0 PS2 18 (SUTURE) ×4 IMPLANT
SUT PROLENE 0 CT 1 30 (SUTURE) ×7 IMPLANT
SUT VIC AB 2-0 CT1 27 (SUTURE) ×3
SUT VIC AB 2-0 CT1 TAPERPNT 27 (SUTURE) ×2 IMPLANT
SUT VIC AB 3-0 SH 27 (SUTURE) ×9
SUT VIC AB 3-0 SH 27X BRD (SUTURE) ×2 IMPLANT
SUT VIC AB 3-0 SH 27XBRD (SUTURE) ×2 IMPLANT
SYR CONTROL 10ML LL (SYRINGE) ×3 IMPLANT
TOWEL GREEN STERILE (TOWEL DISPOSABLE) ×3 IMPLANT
TOWEL GREEN STERILE FF (TOWEL DISPOSABLE) ×3 IMPLANT

## 2020-11-08 NOTE — H&P (Signed)
Derek Davies is an 50 y.o. male.   Chief Complaint: Umbilical hernia, lipoma right upper quadrant abdominal wall HPI: Presents for repair of umbilical hernia with mesh and excision of lipoma right upper quadrant abdominal wall.  Symptoms have been stable since I saw him in the office.  Past Medical History:  Diagnosis Date   GERD (gastroesophageal reflux disease)    Hypertension    Sleep apnea     Past Surgical History:  Procedure Laterality Date   HAND SURGERY Left 1992    Family History  Problem Relation Age of Onset   Heart disease Mother    Hypertension Mother    COPD Brother    Throat cancer Maternal Aunt    Throat cancer Maternal Uncle    Social History:  reports that he has been smoking cigars. He has quit using smokeless tobacco. He reports current alcohol use. He reports that he does not currently use drugs.  Allergies: No Known Allergies  Medications Prior to Admission  Medication Sig Dispense Refill   amLODipine (NORVASC) 5 MG tablet Take 1 tablet (5 mg total) by mouth daily. 90 tablet 1   eszopiclone (LUNESTA) 2 MG TABS tablet Take 1 tablet (2 mg total) by mouth at bedtime as needed for sleep. Take immediately before bedtime 30 tablet 0   fluticasone (FLONASE) 50 MCG/ACT nasal spray Place 2 sprays into both nostrils daily. (Patient taking differently: Place 2 sprays into both nostrils daily as needed for rhinitis or allergies.) 16 g 6   ibuprofen (ADVIL) 200 MG tablet Take 400 mg by mouth every 6 (six) hours as needed for moderate pain or headache.     losartan-hydrochlorothiazide (HYZAAR) 100-25 MG tablet Take 1 tablet by mouth daily. 90 tablet 0   omeprazole (PRILOSEC OTC) 20 MG tablet Take 20 mg by mouth daily.     tadalafil (CIALIS) 20 MG tablet Take 1 tablet (20 mg total) by mouth daily as needed. 30 tablet 1   sodium chloride (OCEAN) 0.65 % SOLN nasal spray Place 2 sprays into both nostrils as needed for congestion. (Patient not taking: Reported on  10/27/2020) 88 mL 12    No results found for this or any previous visit (from the past 48 hour(s)). No results found.  Review of Systems  Blood pressure (!) 148/89, pulse (!) 55, temperature 98.2 F (36.8 C), resp. rate 18, height 5\' 11"  (1.803 m), weight 107 kg, SpO2 95 %. Physical Exam Constitutional:      Appearance: Normal appearance.  HENT:     Head: Normocephalic.  Eyes:     Pupils: Pupils are equal, round, and reactive to light.  Cardiovascular:     Rate and Rhythm: Normal rate and regular rhythm.     Pulses: Normal pulses.     Heart sounds: Normal heart sounds.  Pulmonary:     Effort: Pulmonary effort is normal.     Breath sounds: Normal breath sounds.  Abdominal:     General: Abdomen is flat.     Palpations: Abdomen is soft. There is mass.     Tenderness: There is no guarding or rebound.     Hernia: A hernia is present.     Comments: 3 cm subcutaneous mass right upper quadrant, umbilical hernia mostly reduces  Musculoskeletal:        General: Normal range of motion.     Cervical back: Normal range of motion.  Skin:    General: Skin is warm.  Neurological:     Mental Status: He  is alert and oriented to person, place, and time.  Psychiatric:        Mood and Affect: Mood normal.     Assessment/Plan 1.  Umbilical hernia -for repair of umbilical hernia with mesh 2.  Right upper quadrant abdominal wall lipoma -for excision  I discussed the procedure risks and benefits as described above.  I answered his questions.  I discussed the expected postoperative course and he is agreeable.  Zenovia Jarred, MD 11/08/2020, 6:47 AM

## 2020-11-08 NOTE — Anesthesia Postprocedure Evaluation (Signed)
Anesthesia Post Note  Patient: Derek Davies  Procedure(s) Performed: REPAIR UMBILICAL HERNIA WITH MESH EXCISION LIPOMA RIGHT UPPER QUADRANT OF ABDOMEN (Right)     Patient location during evaluation: PACU Anesthesia Type: General Level of consciousness: awake and alert Pain management: pain level controlled Vital Signs Assessment: post-procedure vital signs reviewed and stable Respiratory status: spontaneous breathing, nonlabored ventilation, respiratory function stable and patient connected to nasal cannula oxygen Cardiovascular status: blood pressure returned to baseline and stable Postop Assessment: no apparent nausea or vomiting Anesthetic complications: no   No notable events documented.  Last Vitals:  Vitals:   11/08/20 0830 11/08/20 0900  BP:  134/78  Pulse:  66  Resp:    Temp: 36.8 C   SpO2: 95% 95%    Last Pain:  Vitals:   11/08/20 0830  PainSc: 0-No pain                 Bassy Fetterly

## 2020-11-08 NOTE — Transfer of Care (Signed)
Immediate Anesthesia Transfer of Care Note  Patient: Derek Davies  Procedure(s) Performed: REPAIR UMBILICAL HERNIA WITH MESH EXCISION LIPOMA RIGHT UPPER QUADRANT OF ABDOMEN (Right)  Patient Location: PACU  Anesthesia Type:General  Level of Consciousness: drowsy and patient cooperative  Airway & Oxygen Therapy: Patient Spontanous Breathing  Post-op Assessment: Report given to RN, Post -op Vital signs reviewed and stable and Patient moving all extremities X 4  Post vital signs: Reviewed and stable  Last Vitals:  Vitals Value Taken Time  BP 147/88 11/08/20 0829  Temp    Pulse 79 11/08/20 0832  Resp 14 11/08/20 0832  SpO2 88 % 11/08/20 0832  Vitals shown include unvalidated device data.  Last Pain:  Vitals:   11/08/20 0601  PainSc: 0-No pain         Complications: No notable events documented.

## 2020-11-08 NOTE — Anesthesia Preprocedure Evaluation (Signed)
Anesthesia Evaluation  Patient identified by MRN, date of birth, ID band Patient awake    Reviewed: Allergy & Precautions, NPO status , Patient's Chart, lab work & pertinent test results  History of Anesthesia Complications Negative for: history of anesthetic complications  Airway Mallampati: III  TM Distance: >3 FB Neck ROM: Full    Dental  (+) Dental Advisory Given, Teeth Intact   Pulmonary neg shortness of breath, neg COPD, neg recent URI, Current Smoker and Patient abstained from smoking.,    breath sounds clear to auscultation       Cardiovascular hypertension, Pt. on medications (-) angina(-) Past MI and (-) CHF (-) dysrhythmias  Rhythm:Regular     Neuro/Psych negative neurological ROS  negative psych ROS   GI/Hepatic Neg liver ROS, GERD  Medicated and Controlled,  Endo/Other  negative endocrine ROS  Renal/GU negative Renal ROS     Musculoskeletal negative musculoskeletal ROS (+)   Abdominal   Peds  Hematology negative hematology ROS (+)   Anesthesia Other Findings   Reproductive/Obstetrics                             Anesthesia Physical Anesthesia Plan  ASA: 2  Anesthesia Plan: General   Post-op Pain Management:    Induction: Intravenous  PONV Risk Score and Plan: 2 and Ondansetron and Dexamethasone  Airway Management Planned: Oral ETT  Additional Equipment: None  Intra-op Plan:   Post-operative Plan: Extubation in OR  Informed Consent: I have reviewed the patients History and Physical, chart, labs and discussed the procedure including the risks, benefits and alternatives for the proposed anesthesia with the patient or authorized representative who has indicated his/her understanding and acceptance.     Dental advisory given  Plan Discussed with: CRNA and Anesthesiologist  Anesthesia Plan Comments:         Anesthesia Quick Evaluation

## 2020-11-08 NOTE — Op Note (Signed)
  11/08/2020  8:22 AM  PATIENT:  Derek Davies  50 y.o. male  PRE-OPERATIVE DIAGNOSIS:  UMBILICAL HERNIA, LIPOMA RUQ ABDOMINAL WALL  POST-OPERATIVE DIAGNOSIS:  UMBILICAL HERNIA, LIPOMA RUQ ABDOMINAL WALL  PROCEDURE:  Procedure(s): REPAIR UMBILICAL HERNIA WITH MESH EXCISION LIPOMA RIGHT UPPER QUADRANT OF ABDOMINAL WALL 2CM WITH LAYERED CLOSURE  SURGEON:  Surgeon(s): Georganna Skeans, MD  ASSISTANTS: none   ANESTHESIA:   local and general  EBL:  No intake/output data recorded.  BLOOD ADMINISTERED:none  DRAINS: none   SPECIMEN:  Excision  DISPOSITION OF SPECIMEN:  PATHOLOGY  COUNTS:  YES  DICTATION: .Dragon Dictation Procedure in detail: Informed consent was obtained.  His site was marked preoperatively.  He was brought to the operating room and general endotracheal anesthesia was administered by the anesthesia staff.  He received intravenous antibiotics.  His abdomen was prepped and draped in a sterile fashion.  We did a timeout procedure.  Local was injected in the periumbilical region.  An infraumbilical incision was made.  Subcutaneous tissues were dissected down.  I carefully dissected the umbilical skin dog and lifted the umbilical skin off of the hernia sac.  The hernia sac contained a small amount of omentum.  This was excised along with a little piece of the omentum.  Hemostasis was obtained.  The hernia was then repaired with a 4.3 cm circular Ventralex mesh.  The flaps of the mesh were sutured to the fascia superiorly and inferiorly with interrupted 0 Prolene sutures.  I placed an additional Prolene suture to close the fascia over the mesh in an inlay fashion.  The suture also secured the mesh to the fascia.  The area was irrigated.  Hemostasis was ensured.  The umbilicus was tacked down with interrupted 2-0 Vicryl.  Subcutaneous tissues were approximated with interrupted 3-0 Vicryl and the skin was closed with 4-0 Monocryl followed by Dermabond.  Attention was then directed  to his right upper quadrant abdominal wall lipoma.  I made a small incision over the palpable mass.  Subcutaneous tissues were dissected down revealing a lobular circumscribed mass of about 2 cm.  This was circumferentially excised.  Cautery was used to get good hemostasis.  Local anesthetic was injected.  The wound was then closed in layers with deep tissues approximated with interrupted 3-0 Vicryl and the skin closed with 4-0 Monocryl followed by Dermabond.  All counts were correct.  He tolerated the procedures well without apparent complication and was taken recovery in stable condition. PATIENT DISPOSITION:  PACU - hemodynamically stable.   Delay start of Pharmacological VTE agent (>24hrs) due to surgical blood loss or risk of bleeding:  no  Georganna Skeans, MD, MPH, FACS Pager: [phone]  11/8/20228:22 AM

## 2020-11-08 NOTE — Anesthesia Procedure Notes (Signed)
Procedure Name: Intubation Date/Time: 11/08/2020 7:40 AM Performed by: Lorie Phenix, CRNA Pre-anesthesia Checklist: Patient identified, Emergency Drugs available, Suction available, Patient being monitored and Timeout performed Patient Re-evaluated:Patient Re-evaluated prior to induction Oxygen Delivery Method: Circle system utilized Preoxygenation: Pre-oxygenation with 100% oxygen Induction Type: IV induction Ventilation: Mask ventilation without difficulty Laryngoscope Size: Mac and 4 Grade View: Grade II Tube type: Oral Number of attempts: 1 Airway Equipment and Method: Stylet and Oral airway Placement Confirmation: ETT inserted through vocal cords under direct vision, positive ETCO2 and breath sounds checked- equal and bilateral Secured at: 22 cm Tube secured with: Tape Dental Injury: Teeth and Oropharynx as per pre-operative assessment

## 2020-11-09 ENCOUNTER — Encounter (HOSPITAL_COMMUNITY): Payer: Self-pay | Admitting: General Surgery

## 2020-11-09 LAB — SURGICAL PATHOLOGY

## 2020-11-14 ENCOUNTER — Encounter (HOSPITAL_COMMUNITY): Payer: Self-pay | Admitting: General Surgery

## 2020-12-07 ENCOUNTER — Telehealth: Payer: Self-pay | Admitting: Family Medicine

## 2020-12-07 NOTE — Telephone Encounter (Signed)
Please advise message below  °

## 2020-12-07 NOTE — Telephone Encounter (Signed)
Pt called stating his chart shows recovering alcoholic and hypergonadism. He doesn't know why these are in his chart and is asking for call from Dr. Juanna Cao staff and wanting chart corrected.   Ph# [phone]

## 2020-12-08 NOTE — Telephone Encounter (Signed)
Patient aware of message below.

## 2020-12-15 ENCOUNTER — Other Ambulatory Visit: Payer: Self-pay

## 2020-12-16 ENCOUNTER — Encounter: Payer: Self-pay | Admitting: Family Medicine

## 2020-12-16 ENCOUNTER — Ambulatory Visit (INDEPENDENT_AMBULATORY_CARE_PROVIDER_SITE_OTHER): Payer: BC Managed Care – PPO | Admitting: Family Medicine

## 2020-12-16 VITALS — BP 140/82 | HR 75 | Temp 97.4°F | Resp 18 | Ht 71.0 in | Wt 243.0 lb

## 2020-12-16 DIAGNOSIS — R195 Other fecal abnormalities: Secondary | ICD-10-CM

## 2020-12-16 DIAGNOSIS — I10 Essential (primary) hypertension: Secondary | ICD-10-CM | POA: Diagnosis not present

## 2020-12-16 DIAGNOSIS — Z0000 Encounter for general adult medical examination without abnormal findings: Secondary | ICD-10-CM

## 2020-12-16 NOTE — Progress Notes (Signed)
Established Patient Office Visit  Subjective:  Patient ID: Derek Davies, male    DOB: [birth year]  Age: 50 y.o. MRN: [id number]  CC:  Chief Complaint  Patient presents with   Annual Exam    CPE, pt is not fasting     HPI Derek Davies presents for yearly physical and follow-up.  Has been doing well.  Typically blood pressure runs in the 1 2040s over 7080 range.  He is compliant with his medications.  Status post recent umbilical hernia repair.  Things went well.  Denies any blood in his stool or hematochezia.  No changes in stooling pattern.  Urine flow is strong.  Rarely drinks alcohol.  If so he has 1 or 2 servings over the weekend.  Past Medical History:  Diagnosis Date   GERD (gastroesophageal reflux disease)    Hypertension    Sleep apnea     Past Surgical History:  Procedure Laterality Date   HAND SURGERY Left 1992   LIPOMA EXCISION Right 11/08/2020   Procedure: EXCISION LIPOMA RIGHT UPPER QUADRANT OF ABDOMEN;  Surgeon: Georganna Skeans, MD;  Location: [facility];  Service: General;  Laterality: Right;   UMBILICAL HERNIA REPAIR N/A 11/08/2020   Procedure: REPAIR UMBILICAL HERNIA WITH MESH;  Surgeon: Georganna Skeans, MD;  Location: [facility];  Service: General;  Laterality: N/A;    Family History  Problem Relation Age of Onset   Heart disease Mother    Hypertension Mother    COPD Brother    Throat cancer Maternal Aunt    Throat cancer Maternal Uncle     Social History   Socioeconomic History   Marital status: Married    Spouse name: Not on file   Number of children: Not on file   Years of education: Not on file   Highest education level: Not on file  Occupational History   Occupation: Truck Geophysicist/field seismologist  Tobacco Use   Smoking status: Some Days    Types: Cigars   Smokeless tobacco: Former   Tobacco comments:    still smoke cigars once and a while/ no cigs.  Vaping Use   Vaping Use: Never used  Substance and Sexual Activity   Alcohol use: Yes    Comment: rarely on  weedends.   Drug use: Not Currently   Sexual activity: Not on file  Other Topics Concern   Not on file  Social History Narrative   Not on file   Social Determinants of Health   Financial Resource Strain: Not on file  Food Insecurity: Not on file  Transportation Needs: Not on file  Physical Activity: Not on file  Stress: Not on file  Social Connections: Not on file  Intimate Partner Violence: Not on file    Outpatient Medications Prior to Visit  Medication Sig Dispense Refill   amLODipine (NORVASC) 5 MG tablet Take 1 tablet (5 mg total) by mouth daily. 90 tablet 1   eszopiclone (LUNESTA) 2 MG TABS tablet Take 1 tablet (2 mg total) by mouth at bedtime as needed for sleep. Take immediately before bedtime 30 tablet 0   fluticasone (FLONASE) 50 MCG/ACT nasal spray Place 2 sprays into both nostrils daily. (Patient taking differently: Place 2 sprays into both nostrils daily as needed for rhinitis or allergies.) 16 g 6   ibuprofen (ADVIL) 200 MG tablet Take 400 mg by mouth every 6 (six) hours as needed for moderate pain or headache.     losartan-hydrochlorothiazide (HYZAAR) 100-25 MG tablet Take 1 tablet by mouth daily.  90 tablet 0   omeprazole (PRILOSEC OTC) 20 MG tablet Take 20 mg by mouth daily.     sodium chloride (OCEAN) 0.65 % SOLN nasal spray Place 2 sprays into both nostrils as needed for congestion. 88 mL 12   tadalafil (CIALIS) 20 MG tablet Take 1 tablet (20 mg total) by mouth daily as needed. 30 tablet 1   oxyCODONE (ROXICODONE) 5 MG immediate release tablet Take 1 tablet (5 mg total) by mouth every 6 (six) hours as needed for breakthrough pain. (Patient not taking: Reported on 12/16/2020) 15 tablet 0   No facility-administered medications prior to visit.    No Known Allergies  ROS Review of Systems  Constitutional:  Negative for diaphoresis, fatigue, fever and unexpected weight change.  HENT: Negative.    Eyes:  Negative for photophobia and visual disturbance.   Respiratory: Negative.    Cardiovascular: Negative.   Gastrointestinal: Negative.  Negative for anal bleeding and blood in stool.  Genitourinary: Negative.   Musculoskeletal:  Negative for gait problem and joint swelling.  Neurological:  Negative for speech difficulty and weakness.  Hematological:  Does not bruise/bleed easily.  Psychiatric/Behavioral: Negative.       Objective:    Physical Exam Vitals and nursing note reviewed.  Constitutional:      General: He is not in acute distress.    Appearance: Normal appearance. He is not ill-appearing, toxic-appearing or diaphoretic.  HENT:     Head: Normocephalic and atraumatic.     Right Ear: Tympanic membrane, ear canal and external ear normal.     Left Ear: Tympanic membrane, ear canal and external ear normal.     Mouth/Throat:     Mouth: Mucous membranes are moist.     Pharynx: Oropharynx is clear. No oropharyngeal exudate or posterior oropharyngeal erythema.  Eyes:     General:        Right eye: No discharge.        Left eye: No discharge.     Extraocular Movements: Extraocular movements intact.     Conjunctiva/sclera: Conjunctivae normal.     Pupils: Pupils are equal, round, and reactive to light.  Cardiovascular:     Rate and Rhythm: Normal rate and regular rhythm.  Pulmonary:     Effort: Pulmonary effort is normal.     Breath sounds: Normal breath sounds.  Abdominal:     General: Abdomen is flat. Bowel sounds are normal.     Hernia: There is no hernia in the left inguinal area or right inguinal area.  Genitourinary:    Penis: Circumcised. No hypospadias, erythema, tenderness, discharge, swelling or lesions.      Testes:        Right: Mass, tenderness or swelling not present. Right testis is descended.        Left: Mass, tenderness or swelling not present. Left testis is descended.     Epididymis:     Right: Not inflamed or enlarged.     Left: Not inflamed or enlarged.     Prostate: Not enlarged and no nodules  present.     Rectum: Guaiac result positive. No mass, tenderness, anal fissure, external hemorrhoid or internal hemorrhoid. Normal anal tone.  Musculoskeletal:     Cervical back: No rigidity or tenderness.  Lymphadenopathy:     Cervical: No cervical adenopathy.     Lower Body: No right inguinal adenopathy. No left inguinal adenopathy.  Skin:    General: Skin is warm and dry.  Neurological:     Mental  Status: He is alert and oriented to person, place, and time.  Psychiatric:        Mood and Affect: Mood normal.        Behavior: Behavior normal.    BP 140/82    Pulse 75    Temp (!) 97.4 F (36.3 C) (Temporal)    Resp 18    Ht 5\' 11"  (1.803 m)    Wt 243 lb (110.2 kg)    SpO2 95%    BMI 33.89 kg/m  Wt Readings from Last 3 Encounters:  12/16/20 243 lb (110.2 kg)  11/08/20 236 lb (107 kg)  11/02/20 236 lb (107 kg)     Health Maintenance Due  Topic Date Due   Pneumococcal Vaccine 56-29 Years old (1 - PCV) Never done   HIV Screening  Never done   Hepatitis C Screening  Never done   TETANUS/TDAP  Never done   COLONOSCOPY (Pts 45-64yrs Insurance coverage will need to be confirmed)  Never done   Zoster Vaccines- Shingrix (1 of 2) Never done    There are no preventive care reminders to display for this patient.  No results found for: TSH Lab Results  Component Value Date   WBC 7.4 11/02/2020   HGB 16.2 11/02/2020   HCT 46.5 11/02/2020   MCV 92.1 11/02/2020   PLT 236 11/02/2020   Lab Results  Component Value Date   NA 139 11/02/2020   K 3.3 (L) 11/02/2020   CO2 27 11/02/2020   GLUCOSE 85 11/02/2020   BUN 8 11/02/2020   CREATININE 1.03 11/02/2020   BILITOT 0.4 09/15/2020   ALKPHOS 58 09/15/2020   AST 14 09/15/2020   ALT 19 09/15/2020   PROT 6.8 09/15/2020   ALBUMIN 4.4 09/15/2020   CALCIUM 9.2 11/02/2020   ANIONGAP 8 11/02/2020   GFR 79.78 09/15/2020   Lab Results  Component Value Date   CHOL 170 09/11/2017   Lab Results  Component Value Date   HDL 44.60  09/11/2017   Lab Results  Component Value Date   LDLCALC 100 (H) 09/11/2017   Lab Results  Component Value Date   TRIG 126.0 09/11/2017   Lab Results  Component Value Date   CHOLHDL 4 09/11/2017   No results found for: HGBA1C    Assessment & Plan:   Problem List Items Addressed This Visit       Cardiovascular and Mediastinum   Essential hypertension - Primary   Relevant Orders   CBC   Comprehensive metabolic panel   Urinalysis, Routine w reflex microscopic   Microalbumin / creatinine urine ratio     Other   Healthcare maintenance   Relevant Orders   CBC   Comprehensive metabolic panel   Hemoglobin A1c   Lipid panel   PSA   Urinalysis, Routine w reflex microscopic   Ambulatory referral to Gastroenterology   Heme positive stool   Relevant Orders   Ambulatory referral to Gastroenterology    No orders of the defined types were placed in this encounter.   Follow-up: Return in about 6 months (around 06/16/2021).   Return fasting for above ordered blood work.  Colonoscopy referral.  Heme positive stool on exam today.  Information given on managing hypertension.  Information given on health maintenance and preventative care.  Information was given on BMI. Libby Maw, MD

## 2020-12-27 ENCOUNTER — Encounter: Payer: Self-pay | Admitting: Gastroenterology

## 2021-01-19 ENCOUNTER — Ambulatory Visit: Payer: BC Managed Care – PPO | Admitting: Gastroenterology

## 2021-01-30 ENCOUNTER — Ambulatory Visit (INDEPENDENT_AMBULATORY_CARE_PROVIDER_SITE_OTHER): Payer: BC Managed Care – PPO | Admitting: Gastroenterology

## 2021-01-30 ENCOUNTER — Encounter: Payer: Self-pay | Admitting: Gastroenterology

## 2021-01-30 VITALS — BP 136/90 | HR 55 | Resp 14 | Ht 71.0 in | Wt 250.2 lb

## 2021-01-30 DIAGNOSIS — K219 Gastro-esophageal reflux disease without esophagitis: Secondary | ICD-10-CM

## 2021-01-30 DIAGNOSIS — R195 Other fecal abnormalities: Secondary | ICD-10-CM

## 2021-01-30 MED ORDER — SUTAB 1479-225-188 MG PO TABS: 1.0000 | ORAL_TABLET | Freq: Once | ORAL | 0 refills | Status: AC

## 2021-01-30 NOTE — Progress Notes (Signed)
HPI : Derek Davies is a very pleasant 51 year old male with hypertension and OSA who is referred to [place] by Dr. Abelino Derrick for a positive FOBT.  The patient received a point of care fecal occult blood test at his most visit with Dr. Ethelene Hal and was told that it was positive.  He denies any history of overt GI bleeding.  He denies any chronic lower GI symptoms.  He does have frequent bowel movements, but this has been his normal habit as long as he can remember.  He typically has 4-5 bowel movements per day.  Stools are typically formed or partially formed.  No abdominal pain, urgency, tenesmus or incontinence.  He has chronic typical GERD symptoms which are well controlled with once daily omeprazole.  He has a remote history of smoking (stopped 15 years ago).  No family history of GI malignancy, although his father does have a history of polyps. The patient underwent a colonoscopy he thinks about 20 years ago which was normal.  He thinks this was colonoscopy was done because of his father's polyps.  He does not know any of the details of his father's polyps (size, number, histology). He also underwent an EGD at that time because of his GERD which he also thinks was normal.  Past Medical History:  Diagnosis Date   GERD (gastroesophageal reflux disease)    Hypertension    Sleep apnea      Past Surgical History:  Procedure Laterality Date   COLONOSCOPY  2000   HAND SURGERY Left 1992   LIPOMA EXCISION Right 11/08/2020   Procedure: EXCISION LIPOMA RIGHT UPPER QUADRANT OF ABDOMEN;  Surgeon: Georganna Skeans, MD;  Location: [facility];  Service: General;  Laterality: Right;   UMBILICAL HERNIA REPAIR N/A 11/08/2020   Procedure: REPAIR UMBILICAL HERNIA WITH MESH;  Surgeon: Georganna Skeans, MD;  Location: [facility];  Service: General;  Laterality: N/A;   Family History  Problem Relation Age of Onset   Heart disease Mother    Hypertension Mother    COPD Brother    Throat cancer Maternal Aunt     Colon cancer Maternal Uncle    Throat cancer Maternal Uncle    Pancreatic cancer Other    Social History   Tobacco Use   Smoking status: Some Days    Types: Cigars   Smokeless tobacco: Former   Tobacco comments:    still smoke cigars once and a while/ no cigs.  Vaping Use   Vaping Use: Never used  Substance Use Topics   Alcohol use: Yes    Comment: rarely on weedends.   Drug use: Not Currently   Current Outpatient Medications  Medication Sig Dispense Refill   eszopiclone (LUNESTA) 2 MG TABS tablet Take 1 tablet (2 mg total) by mouth at bedtime as needed for sleep. Take immediately before bedtime 30 tablet 0   fluticasone (FLONASE) 50 MCG/ACT nasal spray Place 2 sprays into both nostrils daily. (Patient taking differently: Place 2 sprays into both nostrils daily as needed for rhinitis or allergies.) 16 g 6   ibuprofen (ADVIL) 200 MG tablet Take 400 mg by mouth every 6 (six) hours as needed for moderate pain or headache.     losartan-hydrochlorothiazide (HYZAAR) 100-25 MG tablet Take 1 tablet by mouth daily. 90 tablet 0   omeprazole (PRILOSEC OTC) 20 MG tablet Take 20 mg by mouth daily.     sodium chloride (OCEAN) 0.65 % SOLN nasal spray Place 2 sprays into both nostrils  as needed for congestion. 88 mL 12   tadalafil (CIALIS) 20 MG tablet Take 1 tablet (20 mg total) by mouth daily as needed. 30 tablet 1   amLODipine (NORVASC) 5 MG tablet Take 1 tablet (5 mg total) by mouth daily. (Patient not taking: Reported on 01/30/2021) 90 tablet 1   oxyCODONE (ROXICODONE) 5 MG immediate release tablet Take 1 tablet (5 mg total) by mouth every 6 (six) hours as needed for breakthrough pain. (Patient not taking: Reported on 12/16/2020) 15 tablet 0   No current facility-administered medications for this visit.   No Known Allergies   Review of Systems: All systems reviewed and negative except where noted in HPI.    No results found.  Physical Exam: BP 136/90 (BP Location: Right Arm, Patient  Position: Sitting, Cuff Size: Normal)    Pulse (!) 55    Resp 14    Ht 5\' 11"  (1.803 m)    Wt 250 lb 3.2 oz (113.5 kg)    SpO2 93%    BMI 34.90 kg/m  Constitutional: Pleasant,well-developed, Caucasian male in no acute distress. HEENT: Normocephalic and atraumatic. Conjunctivae are normal. No scleral icterus. MP2 Cardiovascular: Normal rate, regular rhythm.  Pulmonary/chest: Effort normal and breath sounds normal. No wheezing, rales or rhonchi. Abdominal: Soft, nondistended, nontender. Bowel sounds active throughout. There are no masses palpable. No hepatomegaly. Extremities: no edema Neurological: Alert and oriented to person place and time. Skin: Skin is warm and dry. No rashes noted. Psychiatric: Normal mood and affect. Behavior is normal.  CBC    Component Value Date/Time   WBC 7.4 11/02/2020 1505   RBC 5.05 11/02/2020 1505   HGB 16.2 11/02/2020 1505   HCT 46.5 11/02/2020 1505   PLT 236 11/02/2020 1505   MCV 92.1 11/02/2020 1505   MCH 32.1 11/02/2020 1505   MCHC 34.8 11/02/2020 1505   RDW 12.1 11/02/2020 1505   LYMPHSABS 2.4 01/26/2020 1609   MONOABS 0.5 01/26/2020 1609   EOSABS 0.0 01/26/2020 1609   BASOSABS 0.1 01/26/2020 1609    CMP     Component Value Date/Time   NA 139 11/02/2020 1505   K 3.3 (L) 11/02/2020 1505   CL 104 11/02/2020 1505   CO2 27 11/02/2020 1505   GLUCOSE 85 11/02/2020 1505   BUN 8 11/02/2020 1505   CREATININE 1.03 11/02/2020 1505   CALCIUM 9.2 11/02/2020 1505   PROT 6.8 09/15/2020 1544   ALBUMIN 4.4 09/15/2020 1544   AST 14 09/15/2020 1544   ALT 19 09/15/2020 1544   ALKPHOS 58 09/15/2020 1544   BILITOT 0.4 09/15/2020 1544   GFRNONAA >60 11/02/2020 1505     ASSESSMENT AND PLAN: 51 year old male with family history of colon polyps, found to have positive point of care fecal occult blood test at his most recent PCM visit.  He has frequent bowel movements at baseline, but otherwise denies any chronic lower GI symptoms to include overt GI  bleeding.  He has chronic GERD symptoms which are well controlled with once daily PPI.  He has a remote history of tobacco use and reportedly had a normal EGD many years ago.  No family history of EAC. Will schedule for colonoscopy to evaluate positive FOBT.    Positive FOBT - Colonoscopy  GERD - Continue omeprazole  The details, risks (including bleeding, perforation, infection, missed lesions, medication reactions and possible hospitalization or surgery if complications occur), benefits, and alternatives to colonoscopy with possible biopsy and possible polypectomy were discussed with the patient and  he consents to proceed.   Oluwaseun Bruyere E. Candis Schatz, MD [facility]  CC:  Libby Maw,*

## 2021-01-30 NOTE — Patient Instructions (Addendum)
If you are age 52 or older, your body mass index should be between 23-30. Your Body mass index is 34.9 kg/m. If this is out of the aforementioned range listed, please consider follow up with your Primary Care Provider.  If you are age 22 or younger, your body mass index should be between 19-25. Your Body mass index is 34.9 kg/m. If this is out of the aformentioned range listed, please consider follow up with your Primary Care Provider.   You have been scheduled for a colonoscopy. Please follow written instructions given to you at your visit today.  Please pick up your prep supplies at the pharmacy within the next 1-3 days. If you use inhalers (even only as needed), please bring them with you on the day of your procedure.  The [facility] providers would like to encourage you to use [facility] to communicate with providers for non-urgent requests or questions.  Due to long hold times on the telephone, sending your provider a message by [facility] may be a faster and more efficient way to get a response.  Please allow 48 business hours for a response.  Please remember that this is for non-urgent requests.   It was a pleasure to see you today!  Thank you for trusting me with your gastrointestinal care!    Scott E.Candis Schatz, MD

## 2021-01-31 ENCOUNTER — Encounter: Payer: Self-pay | Admitting: Gastroenterology

## 2021-02-06 ENCOUNTER — Other Ambulatory Visit: Payer: Self-pay | Admitting: Family Medicine

## 2021-02-06 DIAGNOSIS — I10 Essential (primary) hypertension: Secondary | ICD-10-CM

## 2021-02-10 ENCOUNTER — Telehealth: Payer: Self-pay | Admitting: Family Medicine

## 2021-02-10 DIAGNOSIS — I10 Essential (primary) hypertension: Secondary | ICD-10-CM

## 2021-02-10 NOTE — Telephone Encounter (Signed)
What is the name of the medication? amLODipine (NORVASC) 5 MG tablet [[id number]]   Have you contacted your pharmacy to request a refill? Yes pt is needing a refill on this script.   Which pharmacy would you like this sent to? [facility], [address], [place] [address]  Phone:  [phone]  Fax:  [phone]    Patient notified that their request is being sent to the clinical staff for review and that they should receive a call once it is complete. If they do not receive a call within 72 hours they can check with their pharmacy or our office.

## 2021-02-13 MED ORDER — AMLODIPINE BESYLATE 5 MG PO TABS: 5.0000 mg | ORAL_TABLET | Freq: Every day | ORAL | 1 refills | Status: DC

## 2021-02-14 NOTE — Telephone Encounter (Signed)
Done

## 2021-03-24 ENCOUNTER — Ambulatory Visit (AMBULATORY_SURGERY_CENTER): Payer: BC Managed Care – PPO | Admitting: Gastroenterology

## 2021-03-24 ENCOUNTER — Encounter: Payer: Self-pay | Admitting: Gastroenterology

## 2021-03-24 ENCOUNTER — Other Ambulatory Visit: Payer: Self-pay

## 2021-03-24 VITALS — BP 118/74 | HR 66 | Temp 98.7°F | Resp 14 | Ht 71.0 in | Wt 250.0 lb

## 2021-03-24 DIAGNOSIS — K635 Polyp of colon: Secondary | ICD-10-CM

## 2021-03-24 DIAGNOSIS — Z1211 Encounter for screening for malignant neoplasm of colon: Secondary | ICD-10-CM | POA: Diagnosis not present

## 2021-03-24 DIAGNOSIS — D125 Benign neoplasm of sigmoid colon: Secondary | ICD-10-CM

## 2021-03-24 DIAGNOSIS — K621 Rectal polyp: Secondary | ICD-10-CM

## 2021-03-24 DIAGNOSIS — D128 Benign neoplasm of rectum: Secondary | ICD-10-CM

## 2021-03-24 DIAGNOSIS — R195 Other fecal abnormalities: Secondary | ICD-10-CM

## 2021-03-24 MED ORDER — SODIUM CHLORIDE 0.9 % IV SOLN: 500.0000 mL | Freq: Once | INTRAVENOUS | Status: DC

## 2021-03-24 NOTE — Progress Notes (Signed)
[facility] History and Physical ? ? ?Primary Care Physician:  Libby Maw, MD ? ? ?Reason for Procedure:   Positive fecal occult blood test ? ?Plan:    Colonoscopy ? ? ? ? ?HPI: Derek Davies is a 51 y.o. male undergoing colonoscopy due to a positive FOBT.  He has no family history of colon cancer and no chronic GI symptoms.  His father has a history of colon polyps (unknown details).  He had a colonoscopy about 20 years ago after his father was found to have polyps. ? ? ?Past Medical History:  ?Diagnosis Date  ? GERD (gastroesophageal reflux disease)   ? Hypertension   ? Sleep apnea   ? ? ?Past Surgical History:  ?Procedure Laterality Date  ? COLONOSCOPY  2000  ? HAND SURGERY Left 1992  ? LIPOMA EXCISION Right 11/08/2020  ? Procedure: EXCISION LIPOMA RIGHT UPPER QUADRANT OF ABDOMEN;  Surgeon: Georganna Skeans, MD;  Location: [facility];  Service: General;  Laterality: Right;  ? UMBILICAL HERNIA REPAIR N/A 11/08/2020  ? Procedure: REPAIR UMBILICAL HERNIA WITH MESH;  Surgeon: Georganna Skeans, MD;  Location: [facility];  Service: General;  Laterality: N/A;  ? ? ?Prior to Admission medications   ?Medication Sig Start Date End Date Taking? Authorizing Provider  ?amLODipine (NORVASC) 5 MG tablet Take 1 tablet (5 mg total) by mouth daily. 02/13/21  Yes Libby Maw, MD  ?eszopiclone (LUNESTA) 2 MG TABS tablet Take 1 tablet (2 mg total) by mouth at bedtime as needed for sleep. Take immediately before bedtime 09/15/20  Yes Libby Maw, MD  ?ibuprofen (ADVIL) 200 MG tablet Take 400 mg by mouth every 6 (six) hours as needed for moderate pain or headache.   Yes [provider]  ?losartan-hydrochlorothiazide Konrad Penta) 100-25 MG tablet Take 1 tablet by mouth once daily 02/06/21  Yes Libby Maw, MD  ?omeprazole (PRILOSEC OTC) 20 MG tablet Take 20 mg by mouth daily.   Yes [provider]  ?fluticasone (FLONASE) 50 MCG/ACT nasal spray Place 2 sprays into both nostrils  daily. ?Patient taking differently: Place 2 sprays into both nostrils daily as needed for rhinitis or allergies. 09/15/20   Libby Maw, MD  ?oxyCODONE (ROXICODONE) 5 MG immediate release tablet Take 1 tablet (5 mg total) by mouth every 6 (six) hours as needed for breakthrough pain. ?Patient not taking: Reported on 12/16/2020 11/08/20   Jillyn Ledger, PA-C  ?sodium chloride (OCEAN) 0.65 % SOLN nasal spray Place 2 sprays into both nostrils as needed for congestion. 11/20/18   Libby Maw, MD  ?tadalafil (CIALIS) 20 MG tablet Take 1 tablet (20 mg total) by mouth daily as needed. 09/15/20   Libby Maw, MD  ? ? ?Current Outpatient Medications  ?Medication Sig Dispense Refill  ? amLODipine (NORVASC) 5 MG tablet Take 1 tablet (5 mg total) by mouth daily. 90 tablet 1  ? eszopiclone (LUNESTA) 2 MG TABS tablet Take 1 tablet (2 mg total) by mouth at bedtime as needed for sleep. Take immediately before bedtime 30 tablet 0  ? ibuprofen (ADVIL) 200 MG tablet Take 400 mg by mouth every 6 (six) hours as needed for moderate pain or headache.    ? losartan-hydrochlorothiazide (HYZAAR) 100-25 MG tablet Take 1 tablet by mouth once daily 90 tablet 0  ? omeprazole (PRILOSEC OTC) 20 MG tablet Take 20 mg by mouth daily.    ? fluticasone (FLONASE) 50 MCG/ACT nasal spray Place 2 sprays into both nostrils daily. (Patient taking differently:  Place 2 sprays into both nostrils daily as needed for rhinitis or allergies.) 16 g 6  ? oxyCODONE (ROXICODONE) 5 MG immediate release tablet Take 1 tablet (5 mg total) by mouth every 6 (six) hours as needed for breakthrough pain. (Patient not taking: Reported on 12/16/2020) 15 tablet 0  ? sodium chloride (OCEAN) 0.65 % SOLN nasal spray Place 2 sprays into both nostrils as needed for congestion. 88 mL 12  ? tadalafil (CIALIS) 20 MG tablet Take 1 tablet (20 mg total) by mouth daily as needed. 30 tablet 1  ? ?Current Facility-Administered Medications  ?Medication Dose Route  Frequency Provider Last Rate Last Admin  ? 0.9 %  sodium chloride infusion  500 mL Intravenous Once Daryel November, MD      ? ? ?Allergies as of 03/24/2021  ? (No Known Allergies)  ? ? ?Family History  ?Problem Relation Age of Onset  ? Heart disease Mother   ? Hypertension Mother   ? COPD Brother   ? Throat cancer Maternal Aunt   ? Colon cancer Maternal Uncle   ? Throat cancer Maternal Uncle   ? Pancreatic cancer Other   ? ? ?Social History  ? ?Socioeconomic History  ? Marital status: Married  ?  Spouse name: Not on file  ? Number of children: Not on file  ? Years of education: Not on file  ? Highest education level: Not on file  ?Occupational History  ? Occupation: Administrator  ?Tobacco Use  ? Smoking status: Some Days  ?  Types: Cigars  ? Smokeless tobacco: Former  ? Tobacco comments:  ?  still smoke cigars once and a while/ no cigs.  ?Vaping Use  ? Vaping Use: Never used  ?Substance and Sexual Activity  ? Alcohol use: Yes  ?  Comment: rarely on weedends.  ? Drug use: Not Currently  ? Sexual activity: Not on file  ?Other Topics Concern  ? Not on file  ?Social History Narrative  ? Not on file  ? ?Social Determinants of Health  ? ?Financial Resource Strain: Not on file  ?Food Insecurity: Not on file  ?Transportation Needs: Not on file  ?Physical Activity: Not on file  ?Stress: Not on file  ?Social Connections: Not on file  ?Intimate Partner Violence: Not on file  ? ? ?Review of Systems: ? ?All other review of systems negative except as mentioned in the HPI. ? ?Physical Exam: ?Vital signs ?BP 136/71   Pulse (!) 54   Temp 98.7 ?F (37.1 ?C) (Temporal)   Ht '5\' 11"'$  (1.803 m)   Wt 250 lb (113.4 kg)   SpO2 97%   BMI 34.87 kg/m?  ? ?General:   Alert,  Well-developed, well-nourished, pleasant and cooperative in NAD ?Airway:  Mallampati 3 ?Lungs:  Clear throughout to auscultation.   ?Heart:  Regular rate and rhythm; no murmurs, clicks, rubs,  or gallops. ?Abdomen:  Soft, nontender and nondistended. Normal bowel  sounds.   ?Neuro/Psych:  Normal mood and affect. A and O x 3 ? ? ?Fabienne Nolasco E. Candis Schatz, MD ?[facility] ? ?

## 2021-03-24 NOTE — Progress Notes (Signed)
Called to room to assist during endoscopic procedure.  Patient ID and intended procedure confirmed with present staff. Received instructions for my participation in the procedure from the performing physician.  

## 2021-03-24 NOTE — Op Note (Signed)
[place] [place] ?Patient Name: Derek Davies ?Procedure Date: 03/24/2021 8:02 AM ?MRN: [id number] ?Endoscopist: Vitor Overbaugh E. Candis Schatz , MD ?Age: 51 ?Referring MD:  ?Date of Birth: 26-Sep-1970 ?Gender: Male ?Account #: [id number] ?Procedure:                Colonoscopy ?Indications:              Heme positive stool ?Medicines:                Monitored Anesthesia Care ?Procedure:                Pre-Anesthesia Assessment: ?                          - Prior to the procedure, a History and Physical  ?                          was performed, and patient medications and  ?                          allergies were reviewed. The patient's tolerance of  ?                          previous anesthesia was also reviewed. The risks  ?                          and benefits of the procedure and the sedation  ?                          options and risks were discussed with the patient.  ?                          All questions were answered, and informed consent  ?                          was obtained. Prior Anticoagulants: The patient has  ?                          taken no previous anticoagulant or antiplatelet  ?                          agents. ASA Grade Assessment: III - A patient with  ?                          severe systemic disease. After reviewing the risks  ?                          and benefits, the patient was deemed in  ?                          satisfactory condition to undergo the procedure. ?                          After obtaining informed consent, the colonoscope  ?  was passed under direct vision. Throughout the  ?                          procedure, the patient's blood pressure, pulse, and  ?                          oxygen saturations were monitored continuously. The  ?                          CF [id number] #[id number] was introduced through the anus  ?                          and advanced to the the terminal ileum, with  ?                          identification of the appendiceal  orifice and IC  ?                          valve. The patient tolerated the procedure well.  ?                          The colonoscopy was somewhat difficult due to  ?                          significant looping. Successful completion of the  ?                          procedure was aided by using manual pressure and  ?                          withdrawing and reinserting the scope. The patient  ?                          tolerated the procedure well. The quality of the  ?                          bowel preparation was adequate. The ileocecal  ?                          valve, appendiceal orifice, and rectum were  ?                          photographed. The bowel preparation used was Sutab  ?                          via split dose instruction. ?Scope In: 8:06:33 AM ?Scope Out: 8:27:16 AM ?Scope Withdrawal Time: 0 hours 11 minutes 32 seconds  ?Total Procedure Duration: 0 hours 20 minutes 43 seconds  ?Findings:                 The perianal and digital rectal examinations were  ?                          normal. Pertinent negatives include normal  ?  sphincter tone and no palpable rectal lesions. ?                          Many flat and sessile polyps were found in the  ?                          rectum and sigmoid colon. The polyps were 1 to 4 mm  ?                          in size. Four of these polyps were removed with a  ?                          cold snare. Resection and retrieval were complete.  ?                          Estimated blood loss was minimal. ?                          The exam was otherwise normal throughout the  ?                          examined colon. ?                          The retroflexed view of the distal rectum and anal  ?                          verge was normal and showed no anal or rectal  ?                          abnormalities. ?Complications:            No immediate complications. ?Estimated Blood Loss:     Estimated blood loss was  minimal. ?Impression:               - Many 1 to 4 mm polyps in the rectum and in the  ?                          sigmoid colon, four removed with a cold snare.  ?                          Resected and retrieved. ?                          - The distal rectum and anal verge are normal on  ?                          retroflexion view. ?Recommendation:           - Patient has a contact number available for  ?                          emergencies. The signs and symptoms of potential  ?  delayed complications were discussed with the  ?                          patient. Return to normal activities tomorrow.  ?                          Written discharge instructions were provided to the  ?                          patient. ?                          - Resume previous diet. ?                          - Continue present medications. ?                          - Await pathology results. ?                          - Repeat colonoscopy (date not yet determined) for  ?                          surveillance based on pathology results. ?Jamina Macbeth E. Candis Schatz, MD ?03/24/2021 8:34:31 AM ?This report has been signed electronically. ?

## 2021-03-24 NOTE — Patient Instructions (Addendum)
Handout given for polyps.  YOU HAD AN ENDOSCOPIC PROCEDURE TODAY AT THE  ENDOSCOPY CENTER:   Refer to the procedure report that was given to you for any specific questions about what was found during the examination.  If the procedure report does not answer your questions, please call your gastroenterologist to clarify.  If you requested that your care partner not be given the details of your procedure findings, then the procedure report has been included in a sealed envelope for you to review at your convenience later.  YOU SHOULD EXPECT: Some feelings of bloating in the abdomen. Passage of more gas than usual.  Walking can help get rid of the air that was put into your GI tract during the procedure and reduce the bloating. If you had a lower endoscopy (such as a colonoscopy or flexible sigmoidoscopy) you may notice spotting of blood in your stool or on the toilet paper. If you underwent a bowel prep for your procedure, you may not have a normal bowel movement for a few days.  Please Note:  You might notice some irritation and congestion in your nose or some drainage.  This is from the oxygen used during your procedure.  There is no need for concern and it should clear up in a day or so.  SYMPTOMS TO REPORT IMMEDIATELY:   Following lower endoscopy (colonoscopy or flexible sigmoidoscopy):  Excessive amounts of blood in the stool  Significant tenderness or worsening of abdominal pains  Swelling of the abdomen that is new, acute  Fever of 100F or higher  For urgent or emergent issues, a gastroenterologist can be reached at any hour by calling [phone]. Do not use MyChart messaging for urgent concerns.    DIET:  We do recommend a small meal at first, but then you may proceed to your regular diet.  Drink plenty of fluids but you should avoid alcoholic beverages for 24 hours.  ACTIVITY:  You should plan to take it easy for the rest of today and you should NOT DRIVE or use heavy  machinery until tomorrow (because of the sedation medicines used during the test).    FOLLOW UP: Our staff will call the number listed on your records 48-72 hours following your procedure to check on you and address any questions or concerns that you may have regarding the information given to you following your procedure. If we do not reach you, we will leave a message.  We will attempt to reach you two times.  During this call, we will ask if you have developed any symptoms of COVID 19. If you develop any symptoms (ie: fever, flu-like symptoms, shortness of breath, cough etc.) before then, please call [phone].  If you test positive for Covid 19 in the 2 weeks post procedure, please call and report this information to us.    If any biopsies were taken you will be contacted by phone or by letter within the next 1-3 weeks.  Please call us at [phone] if you have not heard about the biopsies in 3 weeks.    SIGNATURES/CONFIDENTIALITY: You and/or your care partner have signed paperwork which will be entered into your electronic medical record.  These signatures attest to the fact that that the information above on your After Visit Summary has been reviewed and is understood.  Full responsibility of the confidentiality of this discharge information lies with you and/or your care-partner. 

## 2021-03-24 NOTE — Progress Notes (Signed)
Pt non-responsive, VVS, Report to RN  °

## 2021-03-24 NOTE — Progress Notes (Signed)
Pt's states no medical or surgical changes since previsit or office visit. 

## 2021-03-28 ENCOUNTER — Telehealth: Payer: Self-pay | Admitting: *Deleted

## 2021-03-28 NOTE — Telephone Encounter (Signed)
?  Follow up Call- ? ? ?  03/24/2021  ?  7:26 AM  ?Call back number  ?Post procedure Call Back phone  # [phone]  ?Permission to leave phone message Yes  ?  ? ?Patient questions: ? ?Do you have a fever, pain , or abdominal swelling? No. ?Pain Score  0 * ? ?Have you tolerated food without any problems? Yes.   ? ?Have you been able to return to your normal activities? Yes.   ? ?Do you have any questions about your discharge instructions: ?Diet   No. ?Medications  No. ?Follow up visit  No. ? ?Do you have questions or concerns about your Care? No. ? ?Actions: ?* If pain score is 4 or above: ?No action needed, pain <4. ? ? ?

## 2021-03-29 NOTE — Progress Notes (Signed)
Derek Davies,  ?Good news: the polyps that I removed during your recent examination were NOT precancerous.  You should continue to follow current colorectal cancer screening guidelines with a repeat colonoscopy in 10 years.   ? ?If you develop any new rectal bleeding, abdominal pain or significant bowel habit changes, please contact me before then.   ?

## 2021-04-12 ENCOUNTER — Encounter: Payer: Self-pay | Admitting: Family Medicine

## 2021-04-12 ENCOUNTER — Ambulatory Visit: Payer: BC Managed Care – PPO | Admitting: Family Medicine

## 2021-04-12 VITALS — BP 134/78 | HR 61 | Temp 98.2°F | Ht 71.0 in | Wt 243.4 lb

## 2021-04-12 DIAGNOSIS — R0982 Postnasal drip: Secondary | ICD-10-CM | POA: Diagnosis not present

## 2021-04-12 DIAGNOSIS — K219 Gastro-esophageal reflux disease without esophagitis: Secondary | ICD-10-CM | POA: Diagnosis not present

## 2021-04-12 DIAGNOSIS — I10 Essential (primary) hypertension: Secondary | ICD-10-CM | POA: Diagnosis not present

## 2021-04-12 DIAGNOSIS — Z23 Encounter for immunization: Secondary | ICD-10-CM

## 2021-04-12 DIAGNOSIS — J0110 Acute frontal sinusitis, unspecified: Secondary | ICD-10-CM

## 2021-04-12 DIAGNOSIS — N529 Male erectile dysfunction, unspecified: Secondary | ICD-10-CM

## 2021-04-12 MED ORDER — OMEPRAZOLE MAGNESIUM 20 MG PO TBEC: 20.0000 mg | DELAYED_RELEASE_TABLET | Freq: Every day | ORAL | 1 refills | Status: DC

## 2021-04-12 MED ORDER — FLUTICASONE PROPIONATE 50 MCG/ACT NA SUSP: 2.0000 | Freq: Every day | NASAL | 6 refills | Status: DC

## 2021-04-12 MED ORDER — CLARITHROMYCIN ER 500 MG PO TB24: 1000.0000 mg | ORAL_TABLET | Freq: Every day | ORAL | 0 refills | Status: AC

## 2021-04-12 MED ORDER — AMLODIPINE BESYLATE 5 MG PO TABS: 5.0000 mg | ORAL_TABLET | Freq: Every day | ORAL | 1 refills | Status: DC

## 2021-04-12 MED ORDER — LOSARTAN POTASSIUM-HCTZ 100-25 MG PO TABS: 1.0000 | ORAL_TABLET | Freq: Every day | ORAL | 1 refills | Status: DC

## 2021-04-12 MED ORDER — TADALAFIL 20 MG PO TABS: 20.0000 mg | ORAL_TABLET | Freq: Every day | ORAL | 1 refills | Status: DC | PRN

## 2021-04-12 NOTE — Progress Notes (Signed)
? ?Established Patient Office Visit ? ?Subjective:  ?Patient ID: Derek Davies, male    DOB: 05/30/1970  Age: 51 y.o. MRN: [id number] ? ?CC:  ?Chief Complaint  ?Patient presents with  ? Sinus Problem  ?  Cough, pressure in head, some headaches symptoms x 3 weeks.   ? ? ?HPI ?Derek Davies presents for follow-up of hypertension, GERD, allergy rhinitis.  Blood pressure well controlled with amlodipine and losartan with HCTZ.  Uses Prilosec for GERD.  Derek Davies has been working for sleep.  Had been experiencing his usual springtime URI symptoms over the last 3 weeks.  Has been using Zyrtec and nasal saline.  Using Flonase sporadically.  Now has facial pressure in the frontal area.  Occasional headaches on the top of his head.  Denies fevers chills ? ?Past Medical History:  ?Diagnosis Date  ? GERD (gastroesophageal reflux disease)   ? Hypertension   ? Sleep apnea   ? ? ?Past Surgical History:  ?Procedure Laterality Date  ? COLONOSCOPY  2000  ? HAND SURGERY Left 1992  ? LIPOMA EXCISION Right 11/08/2020  ? Procedure: EXCISION LIPOMA RIGHT UPPER QUADRANT OF ABDOMEN;  Surgeon: Georganna Skeans, MD;  Location: [facility];  Service: General;  Laterality: Right;  ? UMBILICAL HERNIA REPAIR N/A 11/08/2020  ? Procedure: REPAIR UMBILICAL HERNIA WITH MESH;  Surgeon: Georganna Skeans, MD;  Location: [facility];  Service: General;  Laterality: N/A;  ? ? ?Family History  ?Problem Relation Age of Onset  ? Heart disease Mother   ? Hypertension Mother   ? COPD Brother   ? Throat cancer Maternal Aunt   ? Colon cancer Maternal Uncle   ? Throat cancer Maternal Uncle   ? Pancreatic cancer Other   ? ? ?Social History  ? ?Socioeconomic History  ? Marital status: Married  ?  Spouse name: Not on file  ? Number of children: Not on file  ? Years of education: Not on file  ? Highest education level: Not on file  ?Occupational History  ? Occupation: Administrator  ?Tobacco Use  ? Smoking status: Some Days  ?  Types: Cigars  ? Smokeless tobacco: Former  ?  Tobacco comments:  ?  still smoke cigars once and a while/ no cigs.  ?Vaping Use  ? Vaping Use: Never used  ?Substance and Sexual Activity  ? Alcohol use: Yes  ?  Comment: rarely on weedends.  ? Drug use: Not Currently  ? Sexual activity: Not on file  ?Other Topics Concern  ? Not on file  ?Social History Narrative  ? Not on file  ? ?Social Determinants of Health  ? ?Financial Resource Strain: Not on file  ?Food Insecurity: Not on file  ?Transportation Needs: Not on file  ?Physical Activity: Not on file  ?Stress: Not on file  ?Social Connections: Not on file  ?Intimate Partner Violence: Not on file  ? ? ?Outpatient Medications Prior to Visit  ?Medication Sig Dispense Refill  ? eszopiclone (LUNESTA) 2 MG TABS tablet Take 1 tablet (2 mg total) by mouth at bedtime as needed for sleep. Take immediately before bedtime 30 tablet 0  ? ibuprofen (ADVIL) 200 MG tablet Take 400 mg by mouth every 6 (six) hours as needed for moderate pain or headache.    ? sodium chloride (OCEAN) 0.65 % SOLN nasal spray Place 2 sprays into both nostrils as needed for congestion. 88 mL 12  ? amLODipine (NORVASC) 5 MG tablet Take 1 tablet (5 mg total) by mouth daily. 90 tablet  1  ? fluticasone (FLONASE) 50 MCG/ACT nasal spray Place 2 sprays into both nostrils daily. (Patient taking differently: Place 2 sprays into both nostrils daily as needed for rhinitis or allergies.) 16 g 6  ? losartan-hydrochlorothiazide (HYZAAR) 100-25 MG tablet Take 1 tablet by mouth once daily 90 tablet 0  ? omeprazole (PRILOSEC OTC) 20 MG tablet Take 20 mg by mouth daily.    ? tadalafil (CIALIS) 20 MG tablet Take 1 tablet (20 mg total) by mouth daily as needed. 30 tablet 1  ? oxyCODONE (ROXICODONE) 5 MG immediate release tablet Take 1 tablet (5 mg total) by mouth every 6 (six) hours as needed for breakthrough pain. (Patient not taking: Reported on 04/12/2021) 15 tablet 0  ? ?No facility-administered medications prior to visit.  ? ? ?No Known Allergies ? ?ROS ?Review of  Systems  ?Constitutional:  Negative for chills, diaphoresis, fatigue, fever and unexpected weight change.  ?HENT:  Positive for congestion, postnasal drip, rhinorrhea, sinus pressure and sinus pain.   ?Eyes:  Negative for photophobia and visual disturbance.  ?Respiratory:  Positive for cough. Negative for shortness of breath and wheezing.   ?Cardiovascular: Negative.   ?Gastrointestinal: Negative.   ?Endocrine: Negative for polyphagia and polyuria.  ?Genitourinary: Negative.   ?Musculoskeletal:  Negative for gait problem and joint swelling.  ?Neurological:  Negative for speech difficulty, weakness and light-headedness.  ? ?  ?Objective:  ?  ?Physical Exam ?Vitals and nursing note reviewed.  ?Constitutional:   ?   General: He is not in acute distress. ?   Appearance: Normal appearance. He is not ill-appearing, toxic-appearing or diaphoretic.  ?HENT:  ?   Head: Normocephalic and atraumatic.  ?   Right Ear: Tympanic membrane, ear canal and external ear normal.  ?   Left Ear: Tympanic membrane, ear canal and external ear normal.  ?   Mouth/Throat:  ?   Mouth: Mucous membranes are moist.  ?   Pharynx: Oropharynx is clear. No oropharyngeal exudate or posterior oropharyngeal erythema.  ?Eyes:  ?   General: No scleral icterus.    ?   Right eye: No discharge.     ?   Left eye: No discharge.  ?   Extraocular Movements: Extraocular movements intact.  ?   Conjunctiva/sclera: Conjunctivae normal.  ?   Pupils: Pupils are equal, round, and reactive to light.  ?Cardiovascular:  ?   Rate and Rhythm: Normal rate and regular rhythm.  ?Pulmonary:  ?   Effort: Pulmonary effort is normal.  ?   Breath sounds: No wheezing or rales.  ?Abdominal:  ?   General: Bowel sounds are normal.  ?Musculoskeletal:  ?   Cervical back: No rigidity or tenderness.  ?Lymphadenopathy:  ?   Cervical: No cervical adenopathy.  ?Skin: ?   General: Skin is warm and dry.  ?Neurological:  ?   Mental Status: He is alert and oriented to person, place, and time.   ?Psychiatric:     ?   Mood and Affect: Mood normal.     ?   Behavior: Behavior normal.  ? ? ?BP 134/78 (BP Location: Right Arm, Patient Position: Sitting, Cuff Size: Large)   Pulse 61   Temp 98.2 ?F (36.8 ?C) (Temporal)   Ht '5\' 11"'$  (1.803 m)   Wt 243 lb 6.4 oz (110.4 kg)   SpO2 94%   BMI 33.95 kg/m?  ?Wt Readings from Last 3 Encounters:  ?04/12/21 243 lb 6.4 oz (110.4 kg)  ?03/24/21 250 lb (113.4 kg)  ?01/30/21  250 lb 3.2 oz (113.5 kg)  ? ? ? ?Health Maintenance Due  ?Topic Date Due  ? HIV Screening  Never done  ? Hepatitis C Screening  Never done  ? ? ?There are no preventive care reminders to display for this patient. ? ?No results found for: TSH ?Lab Results  ?Component Value Date  ? WBC 7.4 11/02/2020  ? HGB 16.2 11/02/2020  ? HCT 46.5 11/02/2020  ? MCV 92.1 11/02/2020  ? PLT 236 11/02/2020  ? ?Lab Results  ?Component Value Date  ? NA 139 11/02/2020  ? K 3.3 (L) 11/02/2020  ? CO2 27 11/02/2020  ? GLUCOSE 85 11/02/2020  ? BUN 8 11/02/2020  ? CREATININE 1.03 11/02/2020  ? BILITOT 0.4 09/15/2020  ? ALKPHOS 58 09/15/2020  ? AST 14 09/15/2020  ? ALT 19 09/15/2020  ? PROT 6.8 09/15/2020  ? ALBUMIN 4.4 09/15/2020  ? CALCIUM 9.2 11/02/2020  ? ANIONGAP 8 11/02/2020  ? GFR 79.78 09/15/2020  ? ?Lab Results  ?Component Value Date  ? CHOL 170 09/11/2017  ? ?Lab Results  ?Component Value Date  ? HDL 44.60 09/11/2017  ? ?Lab Results  ?Component Value Date  ? LDLCALC 100 (H) 09/11/2017  ? ?Lab Results  ?Component Value Date  ? TRIG 126.0 09/11/2017  ? ?Lab Results  ?Component Value Date  ? CHOLHDL 4 09/11/2017  ? ?No results found for: HGBA1C ? ?  ?Assessment & Plan:  ? ?Problem List Items Addressed This Visit   ? ?  ? Cardiovascular and Mediastinum  ? Essential hypertension  ? Relevant Medications  ? amLODipine (NORVASC) 5 MG tablet  ? losartan-hydrochlorothiazide (HYZAAR) 100-25 MG tablet  ? tadalafil (CIALIS) 20 MG tablet  ?  ? Respiratory  ? Acute non-recurrent frontal sinusitis  ? Relevant Medications  ? fluticasone  (FLONASE) 50 MCG/ACT nasal spray  ? clarithromycin (BIAXIN XL) 500 MG 24 hr tablet  ?  ? Digestive  ? Gastroesophageal reflux disease - Primary  ? Relevant Medications  ? omeprazole (PRILOSEC OTC) 20 MG tablet  ?  ? Oth

## 2021-04-25 ENCOUNTER — Telehealth: Payer: Self-pay | Admitting: Family Medicine

## 2021-04-25 DIAGNOSIS — B370 Candidal stomatitis: Secondary | ICD-10-CM

## 2021-04-25 MED ORDER — CLOTRIMAZOLE 10 MG MT TROC: 10.0000 mg | Freq: Every day | OROMUCOSAL | 0 refills | Status: AC

## 2021-04-25 NOTE — Telephone Encounter (Signed)
Please advise message below, patient calling states that he has thrush from antibiotics would like something for this? ?

## 2021-04-25 NOTE — Telephone Encounter (Signed)
Pt was prescribed an atibiotic for sinus infection he did not know the name I could not recognize. He said he now has thrush and needs a med for that. Please advise. ?

## 2021-04-25 NOTE — Telephone Encounter (Signed)
Called pt to inform that Rx was sent in, no answer called wife to inform LM informing patient.  ?

## 2021-07-05 ENCOUNTER — Other Ambulatory Visit: Payer: Self-pay | Admitting: Family Medicine

## 2021-07-05 DIAGNOSIS — G4700 Insomnia, unspecified: Secondary | ICD-10-CM

## 2021-07-05 NOTE — Telephone Encounter (Signed)
Refill request for  Lunesta 2 mg LR 09/15/20, #30, 0 rf LOV 04/12/21 FOV none scheduled.   Please review and advise.  Thanks. Dm/cma

## 2021-07-06 ENCOUNTER — Other Ambulatory Visit (INDEPENDENT_AMBULATORY_CARE_PROVIDER_SITE_OTHER): Payer: BC Managed Care – PPO

## 2021-07-06 DIAGNOSIS — Z0000 Encounter for general adult medical examination without abnormal findings: Secondary | ICD-10-CM

## 2021-07-06 DIAGNOSIS — I10 Essential (primary) hypertension: Secondary | ICD-10-CM | POA: Diagnosis not present

## 2021-07-06 LAB — COMPREHENSIVE METABOLIC PANEL
ALT: 26 U/L (ref 0–53)
AST: 19 U/L (ref 0–37)
Albumin: 4.8 g/dL (ref 3.5–5.2)
Alkaline Phosphatase: 56 U/L (ref 39–117)
BUN: 12 mg/dL (ref 6–23)
CO2: 29 mEq/L (ref 19–32)
Calcium: 9.7 mg/dL (ref 8.4–10.5)
Chloride: 103 mEq/L (ref 96–112)
Creatinine, Ser: 1.23 mg/dL (ref 0.40–1.50)
GFR: 67.87 mL/min (ref >60.00)
Glucose, Bld: 109 mg/dL — ABNORMAL HIGH (ref 70–99)
Potassium: 3.7 mEq/L (ref 3.5–5.1)
Sodium: 141 mEq/L (ref 135–145)
Total Bilirubin: 0.6 mg/dL (ref 0.2–1.2)
Total Protein: 7.1 g/dL (ref 6.0–8.3)

## 2021-07-06 LAB — MICROALBUMIN / CREATININE URINE RATIO
Creatinine,U: 527 mg/dL
Microalb Creat Ratio: 0.5 mg/g (ref 0.0–30.0)
Microalb, Ur: 2.4 mg/dL — ABNORMAL HIGH (ref 0.0–1.9)

## 2021-07-06 LAB — URINALYSIS, ROUTINE W REFLEX MICROSCOPIC
Ketones, ur: NEGATIVE
Leukocytes,Ua: NEGATIVE
Nitrite: NEGATIVE
Specific Gravity, Urine: >=1.03 — AB (ref 1.000–1.030)
Urine Glucose: NEGATIVE
Urobilinogen, UA: 1 (ref 0.0–1.0)
pH: 6 (ref 5.0–8.0)

## 2021-07-06 LAB — LIPID PANEL
Cholesterol: 169 mg/dL (ref 0–200)
HDL: 38.3 mg/dL — ABNORMAL LOW (ref >39.00)
LDL Cholesterol: 113 mg/dL — ABNORMAL HIGH (ref 0–99)
NonHDL: 130.67
Total CHOL/HDL Ratio: 4
Triglycerides: 90 mg/dL (ref 0.0–149.0)
VLDL: 18 mg/dL (ref 0.0–40.0)

## 2021-07-06 LAB — CBC
HCT: 45.5 % (ref 39.0–52.0)
Hemoglobin: 15.5 g/dL (ref 13.0–17.0)
MCHC: 34 g/dL (ref 30.0–36.0)
MCV: 94.7 fl (ref 78.0–100.0)
Platelets: 225 10*3/uL (ref 150.0–400.0)
RBC: 4.8 Mil/uL (ref 4.22–5.81)
RDW: 12.7 % (ref 11.5–15.5)
WBC: 5.4 10*3/uL (ref 4.0–10.5)

## 2021-07-06 LAB — PSA: PSA: 1.38 ng/mL (ref 0.10–4.00)

## 2021-07-06 LAB — HEMOGLOBIN A1C: Hgb A1c MFr Bld: 5.6 % (ref 4.6–6.5)

## 2021-07-07 ENCOUNTER — Telehealth: Payer: Self-pay

## 2021-07-07 NOTE — Telephone Encounter (Signed)
-----   Message from Libby Maw, MD sent at 07/07/2021  7:32 AM EDT ----- Prediabetes is well controlled.  We need to discuss lipid profile at your next visit.     The 10-year ASCVD risk score (Arnett DK, et al., 2019) is: 10.7%   Values used to calculate the score:     Age: 51 years     Sex: Male     Is Non-Hispanic African American: No     Diabetic: No     Tobacco smoker: Yes     Systolic Blood Pressure: 758 mmHg     Is BP treated: Yes     HDL Cholesterol: 38.3 mg/dL     Total Cholesterol: 169 mg/dL

## 2021-07-07 NOTE — Telephone Encounter (Signed)
Call to inform pt of results.

## 2021-09-15 ENCOUNTER — Other Ambulatory Visit: Payer: Self-pay | Admitting: Family Medicine

## 2021-09-15 DIAGNOSIS — G4700 Insomnia, unspecified: Secondary | ICD-10-CM

## 2021-10-24 ENCOUNTER — Ambulatory Visit (INDEPENDENT_AMBULATORY_CARE_PROVIDER_SITE_OTHER): Payer: BC Managed Care – PPO | Admitting: Family Medicine

## 2021-10-24 ENCOUNTER — Encounter: Payer: Self-pay | Admitting: Family Medicine

## 2021-10-24 VITALS — BP 130/94 | HR 67 | Temp 98.4°F | Ht 71.0 in | Wt 242.2 lb

## 2021-10-24 DIAGNOSIS — R6882 Decreased libido: Secondary | ICD-10-CM | POA: Diagnosis not present

## 2021-10-24 DIAGNOSIS — I10 Essential (primary) hypertension: Secondary | ICD-10-CM | POA: Diagnosis not present

## 2021-10-24 DIAGNOSIS — Z23 Encounter for immunization: Secondary | ICD-10-CM

## 2021-10-24 DIAGNOSIS — S161XXA Strain of muscle, fascia and tendon at neck level, initial encounter: Secondary | ICD-10-CM | POA: Diagnosis not present

## 2021-10-24 DIAGNOSIS — J301 Allergic rhinitis due to pollen: Secondary | ICD-10-CM

## 2021-10-24 LAB — BASIC METABOLIC PANEL
BUN: 15 mg/dL (ref 6–23)
CO2: 32 mEq/L (ref 19–32)
Calcium: 10.4 mg/dL (ref 8.4–10.5)
Chloride: 100 mEq/L (ref 96–112)
Creatinine, Ser: 1.25 mg/dL (ref 0.40–1.50)
GFR: 66.43 mL/min (ref >60.00)
Glucose, Bld: 98 mg/dL (ref 70–99)
Potassium: 3.7 mEq/L (ref 3.5–5.1)
Sodium: 140 mEq/L (ref 135–145)

## 2021-10-24 MED ORDER — METHOCARBAMOL 500 MG PO TABS: 500.0000 mg | ORAL_TABLET | Freq: Three times a day (TID) | ORAL | 0 refills | Status: DC | PRN

## 2021-10-24 MED ORDER — LEVOCETIRIZINE DIHYDROCHLORIDE 5 MG PO TABS: 5.0000 mg | ORAL_TABLET | Freq: Every evening | ORAL | 2 refills | Status: AC

## 2021-10-24 NOTE — Progress Notes (Signed)
Established Patient Office Visit  Subjective   Patient ID: Derek Davies, male    DOB: Oct 11, 1970  Age: 51 y.o. MRN: [id number]  Chief Complaint  Patient presents with   Follow-up    6 month follow up. Sinus issues check testosterone medication refill    HPI follow-up of hypertension, low libido and a 2-week history of frontal headache, nasal congestion, itchy watery eyes, watery rhinorrhea, postnasal drip.  There has been no fever or chills body aches, difficulty breathing or wheezing.  Flonase is helped he has been using it about a week now.  Not checking his blood pressure at home.  He did pass a DOT a week ago.  He has also been experiencing posterior neck pain and headache in the back of his head.  He believes that he forgot to take his blood pressure medicine this morning.  Neck is been a little stiff.  There has been no injury.  Denies any weakness or numbness and tingling in his upper extremities    Review of Systems  Constitutional: Negative.   HENT: Negative.    Eyes:  Negative for blurred vision, discharge and redness.  Respiratory: Negative.    Cardiovascular: Negative.   Gastrointestinal:  Negative for abdominal pain.  Genitourinary: Negative.   Musculoskeletal:  Positive for myalgias and neck pain.  Skin:  Negative for rash.  Neurological:  Negative for tingling, loss of consciousness and weakness.  Endo/Heme/Allergies:  Negative for polydipsia.      Objective:     BP (!) 130/94 (BP Location: Right Arm)   Pulse 67   Temp 98.4 F (36.9 C) (Temporal)   Ht '5\' 11"'$  (1.803 m)   Wt 242 lb 3.2 oz (109.9 kg)   SpO2 96%   BMI 33.78 kg/m  BP Readings from Last 3 Encounters:  10/24/21 (!) 130/94  04/12/21 134/78  03/24/21 118/74   Wt Readings from Last 3 Encounters:  10/24/21 242 lb 3.2 oz (109.9 kg)  04/12/21 243 lb 6.4 oz (110.4 kg)  03/24/21 250 lb (113.4 kg)      Physical Exam Constitutional:      General: He is not in acute distress.    Appearance:  Normal appearance. He is not ill-appearing, toxic-appearing or diaphoretic.  HENT:     Head: Normocephalic and atraumatic.     Right Ear: External ear normal.     Left Ear: External ear normal.     Mouth/Throat:     Mouth: Mucous membranes are moist.     Pharynx: Oropharynx is clear. No oropharyngeal exudate or posterior oropharyngeal erythema.  Eyes:     General: No scleral icterus.       Right eye: No discharge.        Left eye: No discharge.     Extraocular Movements: Extraocular movements intact.     Conjunctiva/sclera: Conjunctivae normal.     Pupils: Pupils are equal, round, and reactive to light.  Cardiovascular:     Rate and Rhythm: Normal rate and regular rhythm.  Pulmonary:     Effort: Pulmonary effort is normal. No respiratory distress.     Breath sounds: Normal breath sounds.  Abdominal:     General: Bowel sounds are normal.     Tenderness: There is no abdominal tenderness. There is no guarding.  Musculoskeletal:     Cervical back: No rigidity. No pain with movement. Normal range of motion.  Lymphadenopathy:     Cervical: No cervical adenopathy.  Skin:    General: Skin is  warm and dry.  Neurological:     Mental Status: He is alert and oriented to person, place, and time.  Psychiatric:        Mood and Affect: Mood normal.        Behavior: Behavior normal.      No results found for any visits on 10/24/21.    The 10-year ASCVD risk score (Arnett DK, et al., 2019) is: 10.8%    Assessment & Plan:   Problem List Items Addressed This Visit       Cardiovascular and Mediastinum   Essential hypertension - Primary   Relevant Orders   Basic metabolic panel     Other   Libido, decreased   Relevant Orders   Testosterone Total,Free,Bio, Males-([facility])   Need for influenza vaccination   Relevant Orders   Flu Vaccine QUAD 6+ mos PF IM (Fluarix Quad PF)   Other Visit Diagnoses     Seasonal allergic rhinitis due to pollen       Relevant Medications    levocetirizine (XYZAL ALLERGY 24HR) 5 MG tablet   Strain of neck muscle, initial encounter       Relevant Medications   methocarbamol (ROBAXIN) 500 MG tablet       Return in about 3 months (around 01/24/2022), or Check and record blood pressures periodically..  Be sure to take blood pressure medicines daily as directed.  Added Zyzal for allergic rhinitis controlled.  Continue Flonase.  Robaxin as needed for neck spasms.  Continue stretching exercises.  History of androgen deficiency.  Had taken testosterone gel in the past.  Libby Maw, MD

## 2021-10-25 ENCOUNTER — Telehealth: Payer: Self-pay | Admitting: Family Medicine

## 2021-10-25 DIAGNOSIS — G4700 Insomnia, unspecified: Secondary | ICD-10-CM

## 2021-10-25 DIAGNOSIS — I10 Essential (primary) hypertension: Secondary | ICD-10-CM

## 2021-10-25 DIAGNOSIS — N529 Male erectile dysfunction, unspecified: Secondary | ICD-10-CM

## 2021-10-25 DIAGNOSIS — R0982 Postnasal drip: Secondary | ICD-10-CM

## 2021-10-25 LAB — TESTOSTERONE TOTAL,FREE,BIO, MALES
Albumin: 4.8 g/dL (ref 3.6–5.1)
Sex Hormone Binding: 34 nmol/L (ref 10–50)
Testosterone, Bioavailable: 156.7 ng/dL (ref 110.0–575.0)
Testosterone, Free: 71.7 pg/mL (ref 46.0–224.0)
Testosterone: 547 ng/dL (ref 250–827)

## 2021-10-25 MED ORDER — TADALAFIL 20 MG PO TABS: 20.0000 mg | ORAL_TABLET | Freq: Every day | ORAL | 1 refills | Status: DC | PRN

## 2021-10-25 MED ORDER — LOSARTAN POTASSIUM-HCTZ 100-25 MG PO TABS: 1.0000 | ORAL_TABLET | Freq: Every day | ORAL | 1 refills | Status: DC

## 2021-10-25 MED ORDER — ESZOPICLONE 2 MG PO TABS: ORAL_TABLET | ORAL | 0 refills | Status: DC

## 2021-10-25 MED ORDER — AMLODIPINE BESYLATE 5 MG PO TABS: 5.0000 mg | ORAL_TABLET | Freq: Every day | ORAL | 1 refills | Status: DC

## 2021-10-25 MED ORDER — FLUTICASONE PROPIONATE 50 MCG/ACT NA SUSP: 2.0000 | Freq: Every day | NASAL | 6 refills | Status: AC

## 2021-10-25 NOTE — Telephone Encounter (Signed)
Requested refills sent in  

## 2021-10-25 NOTE — Addendum Note (Signed)
Addended by: Lynda Rainwater on: 10/25/2021 10:47 AM   Modules accepted: Orders

## 2021-10-25 NOTE — Addendum Note (Signed)
Addended by: Lynda Rainwater on: 10/25/2021 11:14 AM   Modules accepted: Orders

## 2021-10-25 NOTE — Telephone Encounter (Signed)
Caller Name: Travas Schexnayder Call back phone #: [phone]   MEDICATION(S):  Cialis, Eszopiclone, Fluticasone, bp med and antibiotic  Has the patient contacted their pharmacy (YES/NO)? Yes, 0 refills What did pharmacy advise?   Preferred Pharmacy:  [facility], [place] Phone:  [phone]  Fax:  [phone]    ~~~Please advise patient/caregiver to allow 2-3 business days to process RX refills.

## 2021-11-13 ENCOUNTER — Encounter: Payer: Self-pay | Admitting: Family Medicine

## 2022-02-05 ENCOUNTER — Telehealth: Payer: Self-pay | Admitting: Family Medicine

## 2022-02-05 NOTE — Telephone Encounter (Signed)
Patient state that Derek Davies does not seen to be working for him anymore and would like to start back on Ambien. Please advise.

## 2022-02-05 NOTE — Telephone Encounter (Signed)
Caller Name: Noe Call back phone #: [phone]   MEDICATION(S):  Pt stating Lunesta not working for him and he would like to change back to Ambien.   Preferred Pharmacy:  [facility], [place] Phone: [phone]  Fax: [phone]

## 2022-02-06 NOTE — Telephone Encounter (Signed)
Spoke with patient who states that he would like for Lunesta to be increased because current dose he wakes up at 3am every morning and not able to go back to sleep.

## 2022-02-07 NOTE — Telephone Encounter (Signed)
Patient needing prescription for increased Lunesta sent to pharmacy.

## 2022-08-22 ENCOUNTER — Telehealth: Payer: Self-pay | Admitting: Family Medicine

## 2022-08-22 NOTE — Telephone Encounter (Signed)
Prescription Request  08/22/2022  LOV: 10/24/2021  What is the name of the medication or equipment? tadalafil (CIALIS) 20 MG tablet [[id number]]   Have you contacted your pharmacy to request a refill? No   Which pharmacy would you like this sent to?   [facility] [address] [place], [place] - [address] [place] [address] Phone: [phone] Fax: [phone]   Patient notified that their request is being sent to the clinical staff for review and that they should receive a response within 2 business days.   Please advise at Mobile [phone] (mobile)

## 2022-08-27 ENCOUNTER — Encounter: Payer: Self-pay | Admitting: Family Medicine

## 2022-08-27 ENCOUNTER — Ambulatory Visit: Payer: BC Managed Care – PPO | Admitting: Family Medicine

## 2022-08-27 VITALS — BP 130/86 | HR 58 | Temp 98.7°F | Ht 71.0 in | Wt 244.8 lb

## 2022-08-27 DIAGNOSIS — E7800 Pure hypercholesterolemia, unspecified: Secondary | ICD-10-CM | POA: Diagnosis not present

## 2022-08-27 DIAGNOSIS — Z125 Encounter for screening for malignant neoplasm of prostate: Secondary | ICD-10-CM | POA: Diagnosis not present

## 2022-08-27 DIAGNOSIS — Z0000 Encounter for general adult medical examination without abnormal findings: Secondary | ICD-10-CM | POA: Diagnosis not present

## 2022-08-27 DIAGNOSIS — G4700 Insomnia, unspecified: Secondary | ICD-10-CM

## 2022-08-27 DIAGNOSIS — I10 Essential (primary) hypertension: Secondary | ICD-10-CM | POA: Diagnosis not present

## 2022-08-27 DIAGNOSIS — N529 Male erectile dysfunction, unspecified: Secondary | ICD-10-CM

## 2022-08-27 DIAGNOSIS — Z5320 Procedure and treatment not carried out because of patient's decision for unspecified reasons: Secondary | ICD-10-CM

## 2022-08-27 LAB — URINALYSIS, ROUTINE W REFLEX MICROSCOPIC
Bilirubin Urine: NEGATIVE
Hgb urine dipstick: NEGATIVE
Ketones, ur: NEGATIVE
Leukocytes,Ua: NEGATIVE
Nitrite: NEGATIVE
RBC / HPF: NONE SEEN (ref 0–?)
Specific Gravity, Urine: <=1.005 — AB (ref 1.000–1.030)
Total Protein, Urine: NEGATIVE
Urine Glucose: NEGATIVE
Urobilinogen, UA: 0.2 (ref 0.0–1.0)
pH: 7 (ref 5.0–8.0)

## 2022-08-27 LAB — CBC
HCT: 46 % (ref 39.0–52.0)
Hemoglobin: 15.8 g/dL (ref 13.0–17.0)
MCHC: 34.4 g/dL (ref 30.0–36.0)
MCV: 92.8 fl (ref 78.0–100.0)
Platelets: 235 10*3/uL (ref 150.0–400.0)
RBC: 4.95 Mil/uL (ref 4.22–5.81)
RDW: 13.1 % (ref 11.5–15.5)
WBC: 6.1 10*3/uL (ref 4.0–10.5)

## 2022-08-27 LAB — COMPREHENSIVE METABOLIC PANEL
ALT: 36 U/L (ref 0–53)
AST: 24 U/L (ref 0–37)
Albumin: 4.7 g/dL (ref 3.5–5.2)
Alkaline Phosphatase: 54 U/L (ref 39–117)
BUN: 11 mg/dL (ref 6–23)
CO2: 31 mEq/L (ref 19–32)
Calcium: 10 mg/dL (ref 8.4–10.5)
Chloride: 101 mEq/L (ref 96–112)
Creatinine, Ser: 1.07 mg/dL (ref 0.40–1.50)
GFR: 79.59 mL/min (ref >60.00)
Glucose, Bld: 102 mg/dL — ABNORMAL HIGH (ref 70–99)
Potassium: 3.5 mEq/L (ref 3.5–5.1)
Sodium: 142 mEq/L (ref 135–145)
Total Bilirubin: 0.8 mg/dL (ref 0.2–1.2)
Total Protein: 7.5 g/dL (ref 6.0–8.3)

## 2022-08-27 LAB — LIPID PANEL
Cholesterol: 188 mg/dL (ref 0–200)
HDL: 39 mg/dL — ABNORMAL LOW (ref >39.00)
LDL Cholesterol: 116 mg/dL — ABNORMAL HIGH (ref 0–99)
NonHDL: 149.26
Total CHOL/HDL Ratio: 5
Triglycerides: 164 mg/dL — ABNORMAL HIGH (ref 0.0–149.0)
VLDL: 32.8 mg/dL (ref 0.0–40.0)

## 2022-08-27 LAB — PSA: PSA: 1.2 ng/mL (ref 0.10–4.00)

## 2022-08-27 MED ORDER — RAMELTEON 8 MG PO TABS: 8.0000 mg | ORAL_TABLET | Freq: Every evening | ORAL | 0 refills | Status: DC | PRN

## 2022-08-27 MED ORDER — SILDENAFIL CITRATE 100 MG PO TABS: 50.0000 mg | ORAL_TABLET | Freq: Every day | ORAL | 11 refills | Status: DC | PRN

## 2022-08-27 NOTE — Progress Notes (Signed)
Established Patient Office Visit   Subjective:  Patient ID: Derek Davies, male    DOB: March 04, 1970  Age: 52 y.o. MRN: [id number]  Chief Complaint  Patient presents with   Medication Refill    Pt is needing a higher dose of the cialis bc it is not working and he stopped the Lunesta bc it has him hung over the next day    Medication Refill Pertinent negatives include no abdominal pain, myalgias, rash or weakness.   Encounter Diagnoses  Name Primary?   Healthcare maintenance Yes   Insomnia, unspecified type    Erectile dysfunction, unspecified erectile dysfunction type    Essential hypertension    Elevated LDL cholesterol level    Prostate cancer screening    Screening for hepatitis C declined    For physical exam and follow-up of above.  Derek Davies is giving him a drug hangover.  Cialis is not working.  Blood pressure at home is running in the 130s over 70s he tells me.  He is fasting.  Job is more stressful.   Review of Systems  Constitutional: Negative.   HENT: Negative.    Eyes:  Negative for blurred vision, discharge and redness.  Respiratory: Negative.    Cardiovascular: Negative.   Gastrointestinal:  Negative for abdominal pain.  Genitourinary: Negative.   Musculoskeletal: Negative.  Negative for myalgias.  Skin:  Negative for rash.  Neurological:  Negative for tingling, loss of consciousness and weakness.  Endo/Heme/Allergies:  Negative for polydipsia.     Current Outpatient Medications:    amLODipine (NORVASC) 5 MG tablet, Take 1 tablet (5 mg total) by mouth daily., Disp: 90 tablet, Rfl: 1   fluticasone (FLONASE) 50 MCG/ACT nasal spray, Place 2 sprays into both nostrils daily., Disp: 16 g, Rfl: 6   ibuprofen (ADVIL) 200 MG tablet, Take 400 mg by mouth every 6 (six) hours as needed for moderate pain or headache., Disp: , Rfl:    levocetirizine (XYZAL ALLERGY 24HR) 5 MG tablet, Take 1 tablet (5 mg total) by mouth every evening., Disp: 30 tablet, Rfl: 2    losartan-hydrochlorothiazide (HYZAAR) 100-25 MG tablet, Take 1 tablet by mouth daily., Disp: 90 tablet, Rfl: 1   methocarbamol (ROBAXIN) 500 MG tablet, Take 1 tablet (500 mg total) by mouth every 8 (eight) hours as needed for muscle spasms., Disp: 30 tablet, Rfl: 0   omeprazole (PRILOSEC OTC) 20 MG tablet, Take 1 tablet (20 mg total) by mouth daily., Disp: 90 tablet, Rfl: 1   ramelteon (ROZEREM) 8 MG tablet, Take 1 tablet (8 mg total) by mouth at bedtime as needed for sleep., Disp: 30 tablet, Rfl: 0   sildenafil (VIAGRA) 100 MG tablet, Take 0.5-1 tablets (50-100 mg total) by mouth daily as needed for erectile dysfunction., Disp: 5 tablet, Rfl: 11   sodium chloride (OCEAN) 0.65 % SOLN nasal spray, Place 2 sprays into both nostrils as needed for congestion., Disp: 88 mL, Rfl: 12   eszopiclone (LUNESTA) 2 MG TABS tablet, TAKE 1 TABLET BY MOUTH AT BEDTIME AS NEEDED FOR SLEEP ---TAKE  IMMEDIATELTY  BEFORE  BEDTIME (Patient not taking: Reported on 08/27/2022), Disp: 30 tablet, Rfl: 0   Objective:     BP 130/86   Pulse (!) 58   Temp 98.7 F (37.1 C)   Ht 5\' 11"  (1.803 m)   Wt 244 lb 12.8 oz (111 kg)   SpO2 94%   BMI 34.14 kg/m  BP Readings from Last 3 Encounters:  08/27/22 130/86  10/24/21 (!) 130/94  04/12/21 134/78   Wt Readings from Last 3 Encounters:  08/27/22 244 lb 12.8 oz (111 kg)  10/24/21 242 lb 3.2 oz (109.9 kg)  04/12/21 243 lb 6.4 oz (110.4 kg)      Physical Exam Constitutional:      General: He is not in acute distress.    Appearance: Normal appearance. He is not ill-appearing, toxic-appearing or diaphoretic.  HENT:     Head: Normocephalic and atraumatic.     Right Ear: External ear normal.     Left Ear: External ear normal.     Mouth/Throat:     Mouth: Mucous membranes are moist.     Pharynx: Oropharynx is clear. No oropharyngeal exudate or posterior oropharyngeal erythema.  Eyes:     General: No scleral icterus.       Right eye: No discharge.        Left eye: No  discharge.     Extraocular Movements: Extraocular movements intact.     Conjunctiva/sclera: Conjunctivae normal.     Pupils: Pupils are equal, round, and reactive to light.  Cardiovascular:     Rate and Rhythm: Normal rate and regular rhythm.  Pulmonary:     Effort: Pulmonary effort is normal. No respiratory distress.     Breath sounds: Normal breath sounds.  Abdominal:     General: Bowel sounds are normal.  Musculoskeletal:     Cervical back: No rigidity or tenderness.  Skin:    General: Skin is warm and dry.  Neurological:     Mental Status: He is alert and oriented to person, place, and time.  Psychiatric:        Mood and Affect: Mood normal.        Behavior: Behavior normal.      No results found for any visits on 08/27/22.    The 10-year ASCVD risk score (Arnett DK, et al., 2019) is: 10.8%    Assessment & Plan:   Healthcare maintenance  Insomnia, unspecified type -     Ramelteon; Take 1 tablet (8 mg total) by mouth at bedtime as needed for sleep.  Dispense: 30 tablet; Refill: 0  Erectile dysfunction, unspecified erectile dysfunction type -     Sildenafil Citrate; Take 0.5-1 tablets (50-100 mg total) by mouth daily as needed for erectile dysfunction.  Dispense: 5 tablet; Refill: 11  Essential hypertension -     CBC -     Comprehensive metabolic panel -     Urinalysis, Routine w reflex microscopic  Elevated LDL cholesterol level -     Comprehensive metabolic panel -     Lipid panel  Prostate cancer screening -     PSA  Screening for hepatitis C declined    Return in about 6 months (around 02/27/2023), or Please try to lose weight to avoid medication increases..  Information was given on health maintenance, disease prevention and exercising to lose weight.  Will try Rozerem for sleep to avoid drug hangover.  Information given on insomnia.  Information was given on managing hypertension.  Information was given on ED.  Will try Viagra.  Mliss Sax, MD

## 2022-10-18 ENCOUNTER — Other Ambulatory Visit: Payer: Self-pay | Admitting: Family Medicine

## 2022-10-18 DIAGNOSIS — I10 Essential (primary) hypertension: Secondary | ICD-10-CM

## 2023-02-13 ENCOUNTER — Other Ambulatory Visit: Payer: Self-pay | Admitting: Family Medicine

## 2023-02-13 DIAGNOSIS — I10 Essential (primary) hypertension: Secondary | ICD-10-CM

## 2023-03-29 IMAGING — DX DG ANKLE COMPLETE 3+V*L*
3 series · 3 of 3 positions shown · non-contrast
Comparison: None.

CLINICAL DATA: left ankle/foot inversion injury

EXAM:
LEFT ANKLE COMPLETE - 3+ VIEW

[ankle ap]
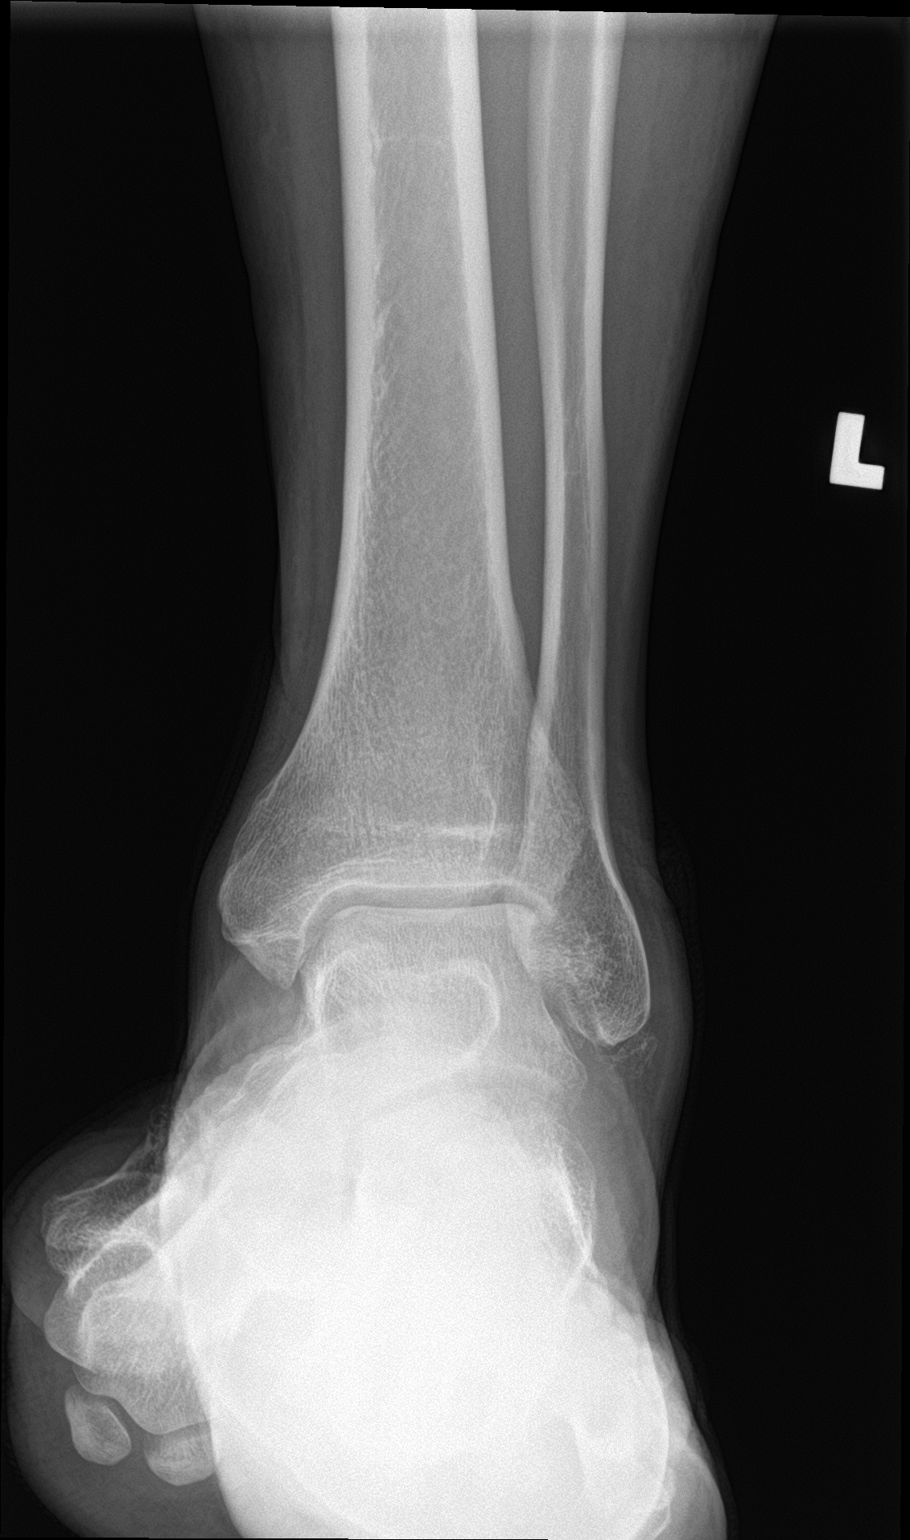

[ankle obl]
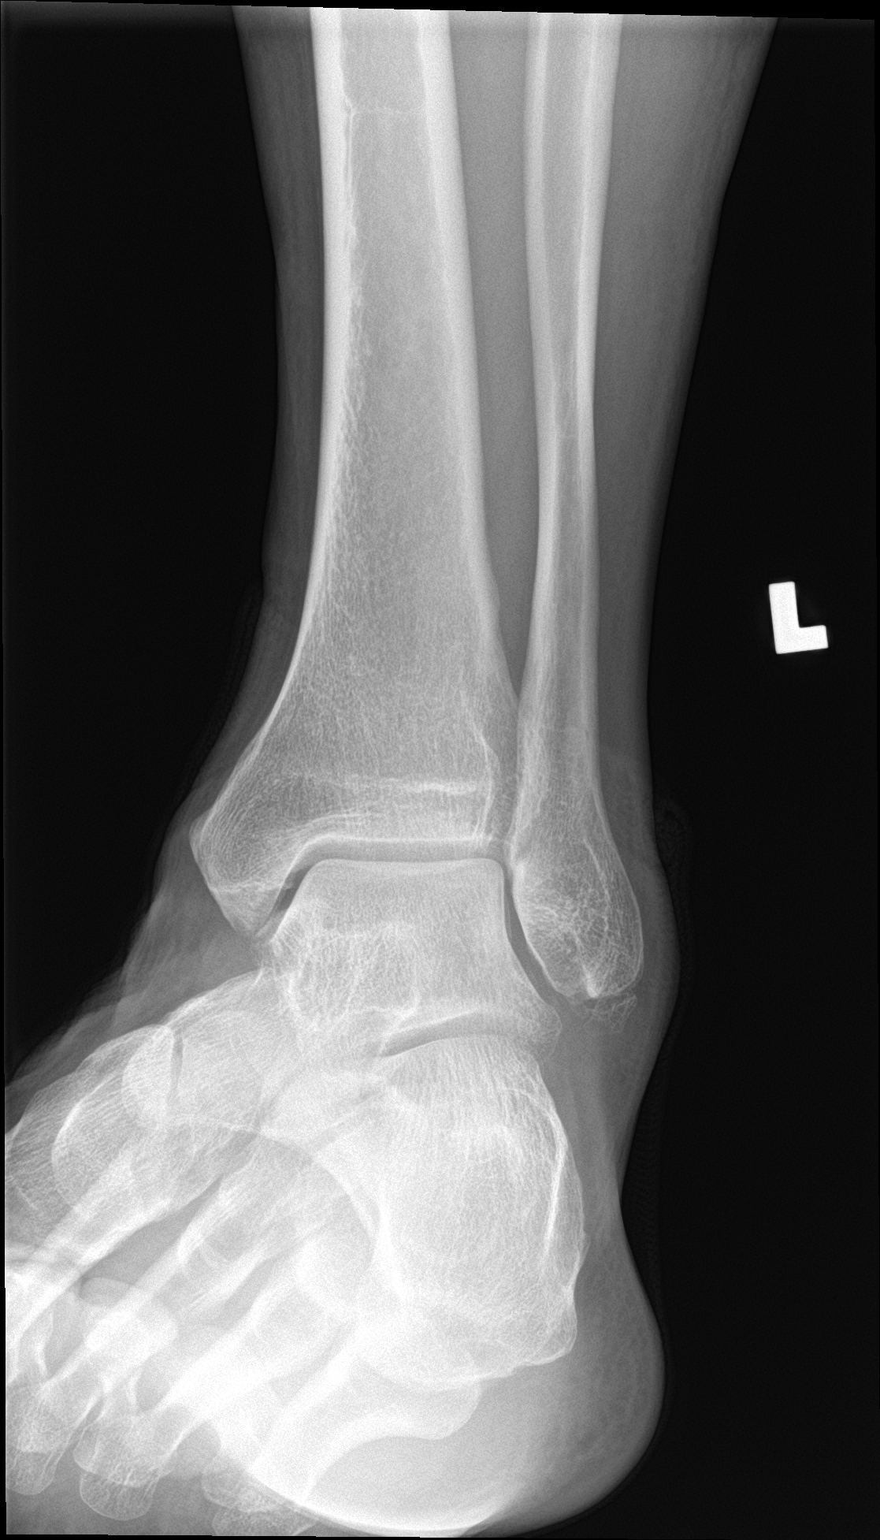

[ankle lat]
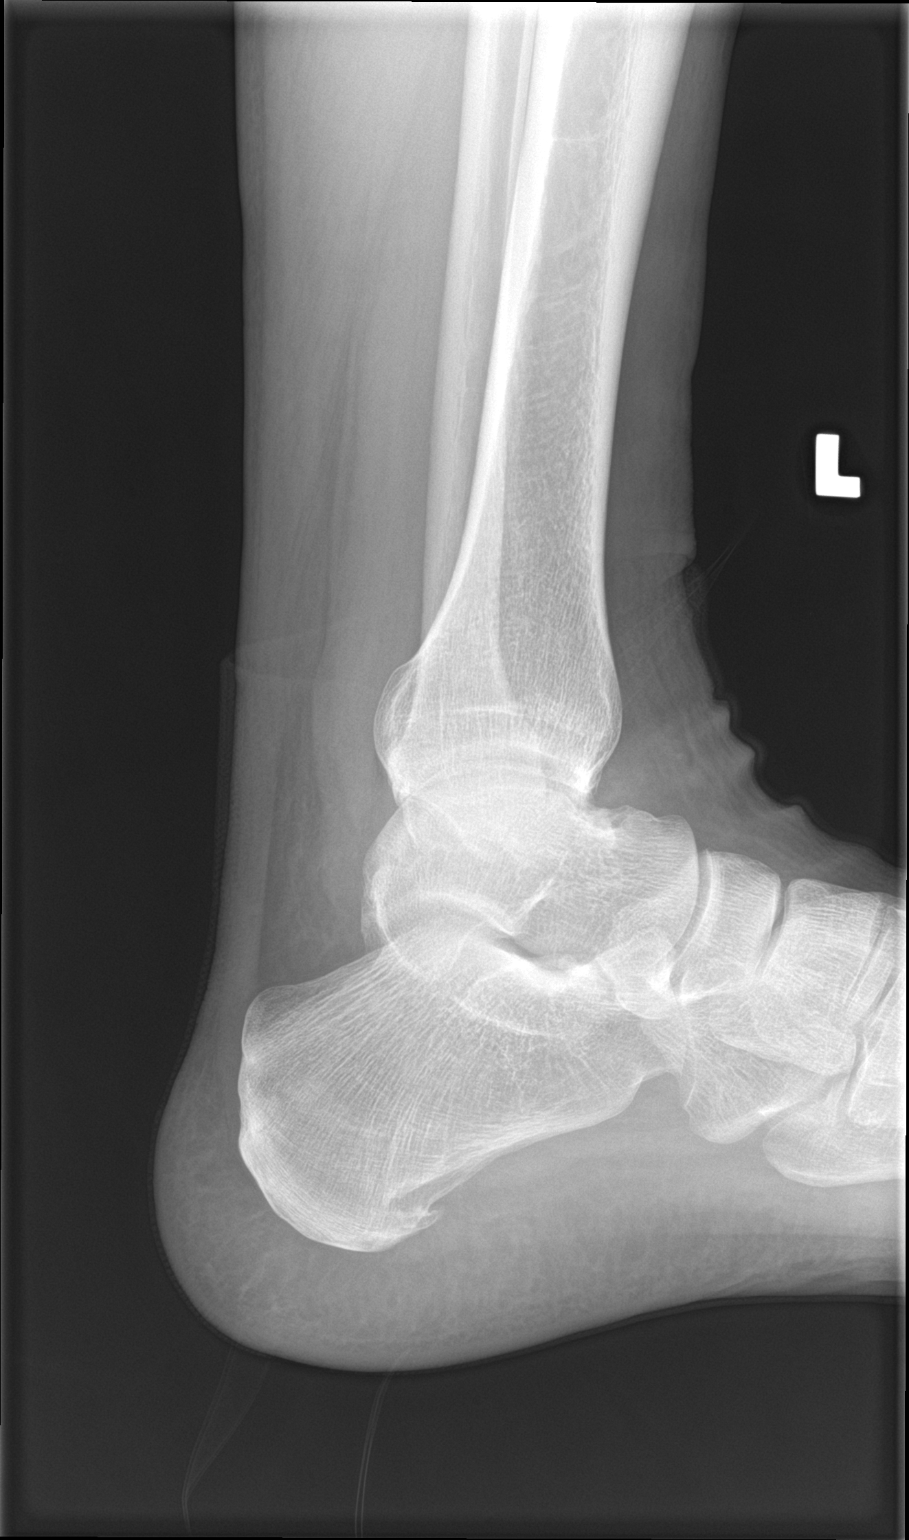

[3 of 3 positions shown; findings below may reference images not displayed]

FINDINGS: Well corticated osseous fragments adjacent to the lateral malleolus,
consistent with old injury. There is no evidence of acute fracture.
Mild lateral ankle soft tissue swelling. Plantar calcaneal spur.
Mild tibiotalar arthritis.
IMPRESSION: Mild lateral ankle soft tissue swelling without acute osseous
abnormality. Evidence of old lateral ankle injury.

## 2023-03-29 IMAGING — DX DG FOOT COMPLETE 3+V*L*
3 series · 3 of 3 positions shown · non-contrast
Comparison: None.

CLINICAL DATA: left ankle/foot inversion injury

EXAM:
LEFT FOOT - COMPLETE 3+ VIEW

[foot ap]
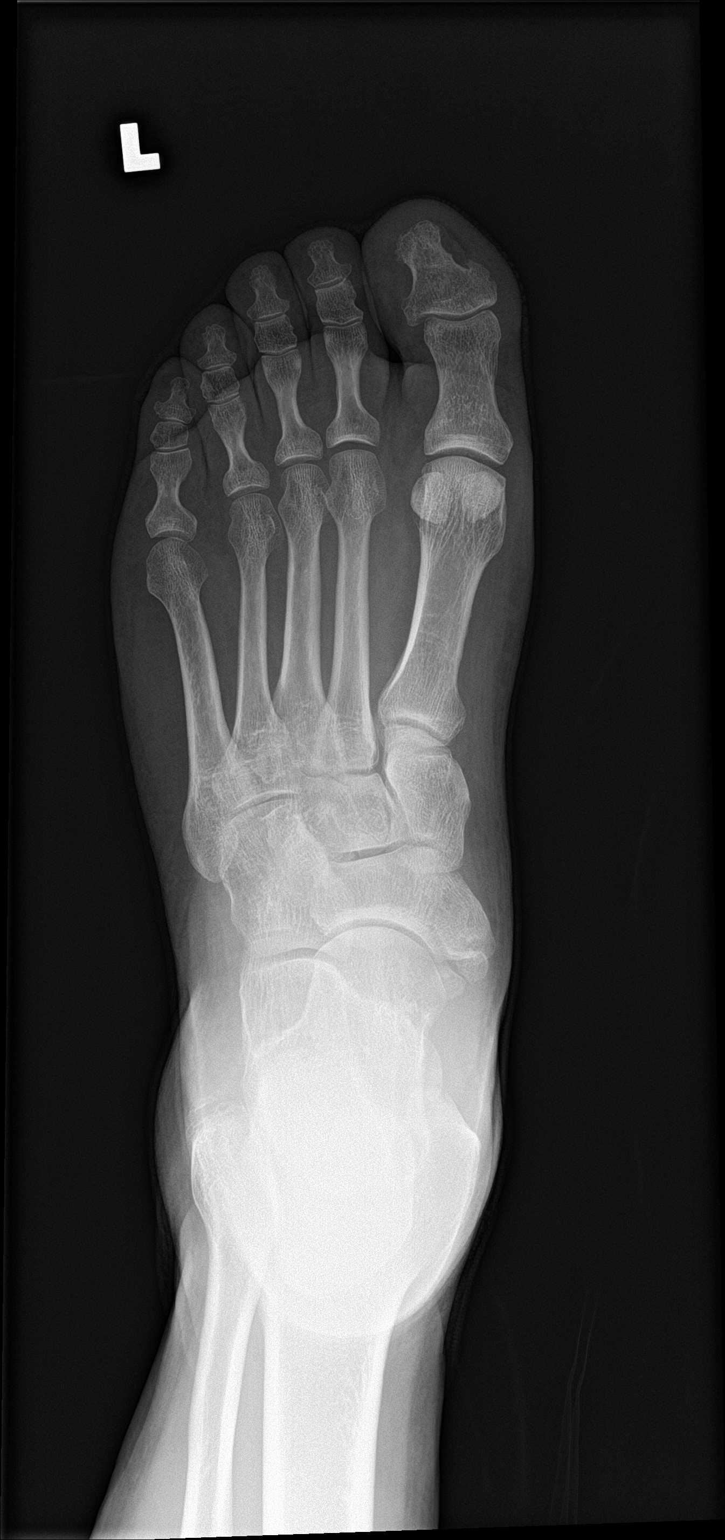

[foot obl]
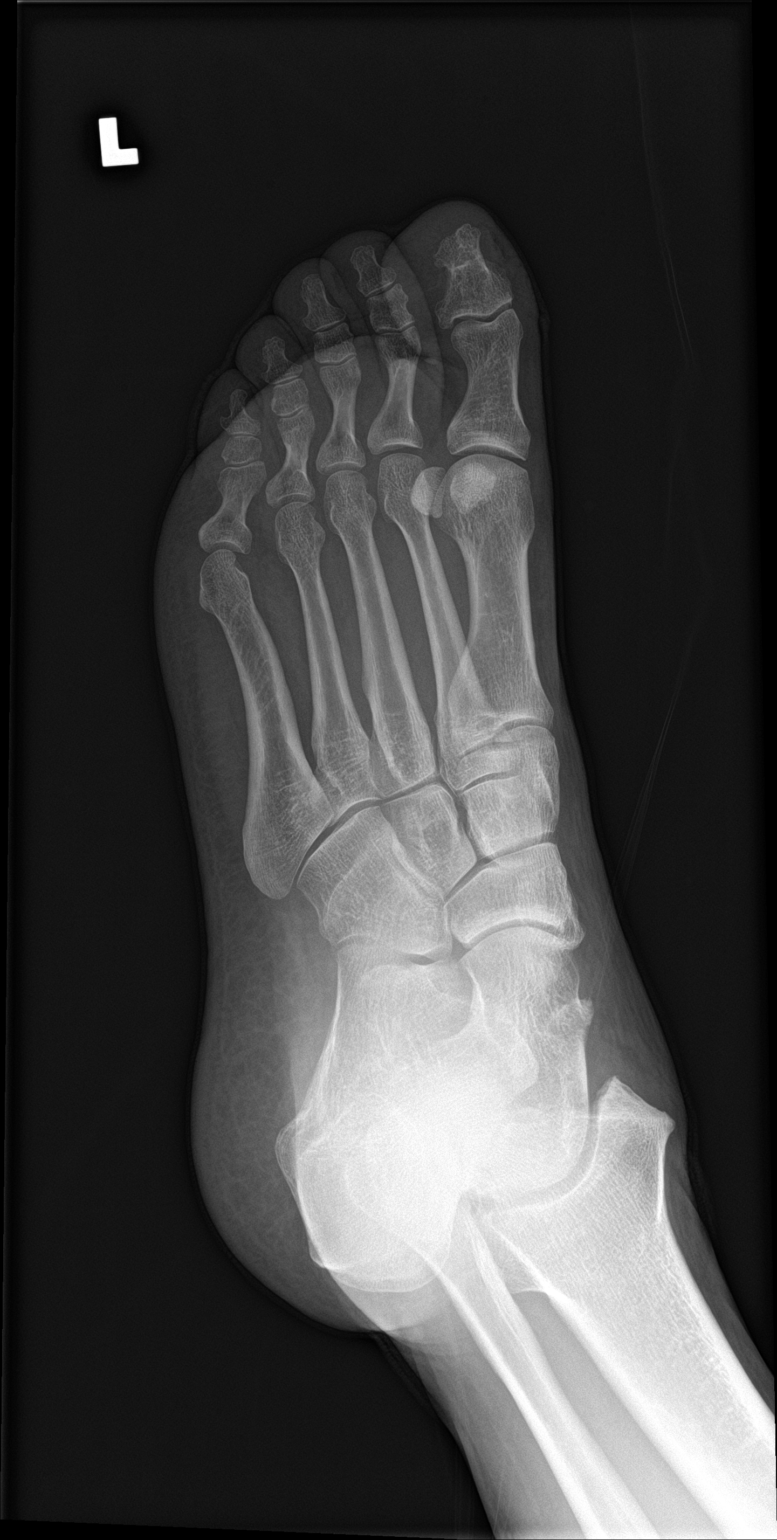

[foot lat]
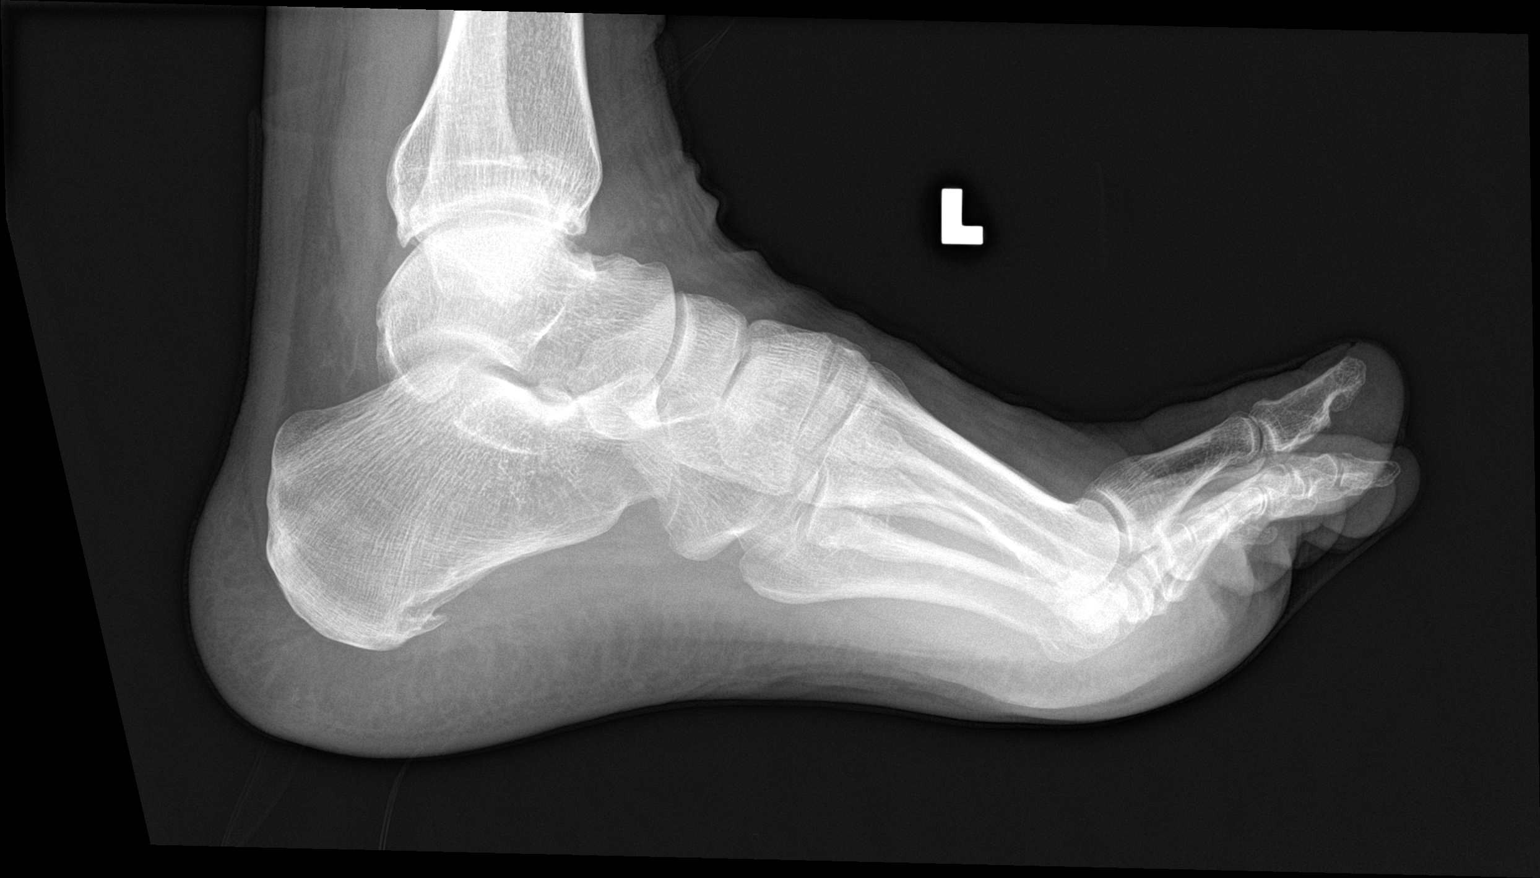

[3 of 3 positions shown; findings below may reference images not displayed]

FINDINGS: There is no evidence of acute fracture. There is no significant
degenerative change. Os navicularis. Mild tibiotalar degenerative
change. Plantar calcaneal spur.
IMPRESSION: No acute osseous abnormality in the left foot.

## 2023-04-15 ENCOUNTER — Other Ambulatory Visit: Payer: Self-pay | Admitting: Family Medicine

## 2023-04-15 DIAGNOSIS — I10 Essential (primary) hypertension: Secondary | ICD-10-CM

## 2023-04-18 ENCOUNTER — Other Ambulatory Visit: Payer: Self-pay | Admitting: Family Medicine

## 2023-04-18 DIAGNOSIS — I10 Essential (primary) hypertension: Secondary | ICD-10-CM

## 2023-04-18 NOTE — Telephone Encounter (Signed)
 Copied from CRM [id number]. Topic: Clinical - Medication Refill >> Apr 18, 2023 12:36 PM Deaijah H wrote: Most Recent Primary Care Visit:  Provider: Tonna Frederic  Department: [facility]  Visit Type: OFFICE VISIT  Date: 08/27/2022  Medication: amLODipine (NORVASC) 5 MG tablet/losartan-hydrochlorothiazide (HYZAAR) 100-25 MG tablet  Has the patient contacted their pharmacy? Yes (Agent: If no, request that the patient contact the pharmacy for the refill. If patient does not wish to contact the pharmacy document the reason why and proceed with request.) (Agent: If yes, when and what did the pharmacy advise?) Advised to give Dr. Tilmon Font a call  Is this the correct pharmacy for this prescription? Yes If no, delete pharmacy and type the correct one.  This is the patient's preferred pharmacy:    [facility] [address] [place] - [address] [place] [address] Phone: [phone] Fax: [phone]   Has the prescription been filled recently? No  Is the patient out of the medication? Yes  Has the patient been seen for an appointment in the last year OR does the patient have an upcoming appointment? Yes  Can we respond through MyChart? Yes  Agent: Please be advised that Rx refills may take up to 3 business days. We ask that you follow-up with your pharmacy.

## 2023-04-22 MED ORDER — LOSARTAN POTASSIUM-HCTZ 100-25 MG PO TABS: 1.0000 | ORAL_TABLET | Freq: Every day | ORAL | 0 refills | Status: DC

## 2023-04-22 MED ORDER — AMLODIPINE BESYLATE 5 MG PO TABS: 5.0000 mg | ORAL_TABLET | Freq: Every day | ORAL | 0 refills | Status: DC

## 2023-05-02 ENCOUNTER — Ambulatory Visit: Admitting: Family Medicine

## 2023-05-09 ENCOUNTER — Ambulatory Visit: Admitting: Family Medicine

## 2023-06-18 ENCOUNTER — Encounter: Payer: Self-pay | Admitting: Family Medicine

## 2023-06-18 ENCOUNTER — Ambulatory Visit: Admitting: Family Medicine

## 2023-06-18 VITALS — BP 142/88 | HR 56 | Temp 97.8°F | Ht 71.0 in | Wt 248.2 lb

## 2023-06-18 DIAGNOSIS — N529 Male erectile dysfunction, unspecified: Secondary | ICD-10-CM

## 2023-06-18 DIAGNOSIS — I10 Essential (primary) hypertension: Secondary | ICD-10-CM

## 2023-06-18 DIAGNOSIS — Z87898 Personal history of other specified conditions: Secondary | ICD-10-CM | POA: Diagnosis not present

## 2023-06-18 DIAGNOSIS — F5101 Primary insomnia: Secondary | ICD-10-CM | POA: Diagnosis not present

## 2023-06-18 DIAGNOSIS — B079 Viral wart, unspecified: Secondary | ICD-10-CM | POA: Insufficient documentation

## 2023-06-18 MED ORDER — LOSARTAN POTASSIUM-HCTZ 100-25 MG PO TABS
1.0000 | ORAL_TABLET | Freq: Every day | ORAL | 3 refills | Status: AC
Start: 2023-06-18 — End: 2024-06-12

## 2023-06-18 MED ORDER — VARDENAFIL HCL 20 MG PO TABS: ORAL_TABLET | ORAL | 0 refills | Status: DC

## 2023-06-18 MED ORDER — AMLODIPINE BESYLATE 5 MG PO TABS: 5.0000 mg | ORAL_TABLET | Freq: Every day | ORAL | 3 refills | Status: AC

## 2023-06-18 NOTE — Progress Notes (Signed)
 Assessment & Plan   Assessment/Plan:     Assessment & Plan Hypertension Mildly elevated hypertension likely due to non-adherence to antihypertensive medications for about a week. No chest pain or dyspnea reported. Current medications include amlodipine  and losartan  hydrochlorothiazide . - Prescribe amlodipine  5 mg and losartan  hydrochlorothiazide  100 mg/25 mg once daily - Reassess blood pressure at follow-up visit  Erectile Dysfunction Erectile dysfunction with previous use of sildenafil  and tadalafil  at maximum doses. Sildenafil  causes headaches, and tadalafil  is ineffective due to stress. Vardenafil has not been tried. Discussed that PDE5 inhibitors may not be effective for everyone and alternative treatments include injectables, requiring urology consultation. - Prescribe vardenafil 20 mg tablets, instruct to take 10 mg to 20 mg as needed, one hour before sexual activity - Advise against concurrent use of vardenafil and sildenafil  - Discuss potential side effects of vardenafil, including headaches  Insomnia Insomnia with previous effective use of Ambien , discontinued due to sleepwalking concerns. Lunesta  and Ramelteon  are less effective. Plan to discuss newer treatment options at follow-up. - Discuss insomnia treatment options at follow-up visit in about a month  Warts Recurrent warts on finger and side, previously treated with cryotherapy. Dermatology referral is appropriate for evaluation and potential removal, especially for genital warts. - Refer to dermatology for evaluation and potential removal of warts  General Health Maintenance No recent lab work in the past year. Emphasized the importance of regular check-ups. - Schedule a physical exam with fasting lab work at follow-up visit      Medications Discontinued During This Encounter  Medication Reason   sildenafil  (VIAGRA ) 100 MG tablet    methocarbamol  (ROBAXIN ) 500 MG tablet    omeprazole  (PRILOSEC  OTC) 20 MG tablet     ramelteon  (ROZEREM ) 8 MG tablet    eszopiclone  (LUNESTA ) 2 MG TABS tablet    losartan -hydrochlorothiazide  (HYZAAR) 100-25 MG tablet Reorder   amLODipine  (NORVASC ) 5 MG tablet Reorder    Return in about 1 month (around 07/18/2023) for physical (fasting labs).        Subjective:   Encounter date: 06/18/2023  Derek Davies is a 53 y.o. male who has Plantar fasciitis, bilateral; Equinus deformity of foot, acquired; Essential hypertension; Erectile dysfunction; Prostate cancer screening; Libido, decreased; Snores; Post-nasal drip; Need for influenza vaccination; Congenital cavus foot; Heme positive stool; Gastroesophageal reflux disease; Acute non-recurrent frontal sinusitis; Need for Tdap vaccination; Screening for hepatitis C declined; Elevated LDL cholesterol level; and Verruca warts (infectious) on their problem list..   He  has a past medical history of GERD (gastroesophageal reflux disease), History of alcohol use disorder (06/18/2023), Hypertension, Insomnia (09/05/2017), and Sleep apnea.Derek Davies   He presents with chief complaint of Transitions Of Care (Discuss medication changes and refills ) .   Discussed the use of AI scribe software for clinical note transcription with the patient, who gave verbal consent to proceed.  History of Present Illness Derek Davies is a 53 year old male who presents for management of hypertension and erectile dysfunction.  He has not taken his blood pressure medication for about a week after running out. His current medications for hypertension include amlodipine  5 mg and losartan  hydrochlorothiazide  100 mg/25 mg once daily. No chest pain or shortness of breath.  He is concerned about inaccuracies in his medical record, specifically regarding a history of hepatitis and depression, which he denies having. He has a past history of alcoholism and drinks alcohol occasionally, about once every two weeks.  He experiences erectile dysfunction and finds  sildenafil  (Viagra ) ineffective due  to headaches. He previously used tadalafil  (Cialis ) but found it ineffective due to stress. He has not tried vardenafil (Levitra).  He has a history of insomnia and has tried various medications including Lunesta  and Ambien . Ambien  was effective but discontinued due to concerns about sleepwalking. He wakes up around midnight to 1 AM despite trying other medications.  He inquires about treatment for warts on his finger and side, noting previous treatments with freezing and expressing interest in dermatological options.       Past Surgical History:  Procedure Laterality Date   COLONOSCOPY  2000   HAND SURGERY Left 1992   LIPOMA EXCISION Right 11/08/2020   Procedure: EXCISION LIPOMA RIGHT UPPER QUADRANT OF ABDOMEN;  Surgeon: Dorena Gander, MD;  Location: [facility] OR;  Service: General;  Laterality: Right;   UMBILICAL HERNIA REPAIR N/A 11/08/2020   Procedure: REPAIR UMBILICAL HERNIA WITH MESH;  Surgeon: Dorena Gander, MD;  Location: [facility] OR;  Service: General;  Laterality: N/A;    Outpatient Medications Prior to Visit  Medication Sig Dispense Refill   fluticasone  (FLONASE ) 50 MCG/ACT nasal spray Place 2 sprays into both nostrils daily. (Patient taking differently: Place 2 sprays into both nostrils as needed for allergies or rhinitis.) 16 g 6   ibuprofen (ADVIL) 200 MG tablet Take 400 mg by mouth every 6 (six) hours as needed for moderate pain or headache.     levocetirizine (XYZAL  ALLERGY 24HR) 5 MG tablet Take 1 tablet (5 mg total) by mouth every evening. (Patient taking differently: Take 5 mg by mouth as needed for allergies.) 30 tablet 2   sodium chloride  (OCEAN) 0.65 % SOLN nasal spray Place 2 sprays into both nostrils as needed for congestion. 88 mL 12   amLODipine  (NORVASC ) 5 MG tablet Take 1 tablet (5 mg total) by mouth daily. 30 tablet 0   losartan -hydrochlorothiazide  (HYZAAR) 100-25 MG tablet Take 1 tablet by mouth daily. 30 tablet 0   omeprazole   (PRILOSEC  OTC) 20 MG tablet Take 1 tablet (20 mg total) by mouth daily. (Patient taking differently: Take 20 mg by mouth as needed.) 90 tablet 1   sildenafil  (VIAGRA ) 100 MG tablet Take 0.5-1 tablets (50-100 mg total) by mouth daily as needed for erectile dysfunction. 5 tablet 11   eszopiclone  (LUNESTA ) 2 MG TABS tablet TAKE 1 TABLET BY MOUTH AT BEDTIME AS NEEDED FOR SLEEP ---TAKE  IMMEDIATELTY  BEFORE  BEDTIME (Patient not taking: Reported on 06/18/2023) 30 tablet 0   methocarbamol  (ROBAXIN ) 500 MG tablet Take 1 tablet (500 mg total) by mouth every 8 (eight) hours as needed for muscle spasms. (Patient not taking: Reported on 06/18/2023) 30 tablet 0   ramelteon  (ROZEREM ) 8 MG tablet Take 1 tablet (8 mg total) by mouth at bedtime as needed for sleep. (Patient not taking: Reported on 06/18/2023) 30 tablet 0   No facility-administered medications prior to visit.    Family History  Problem Relation Age of Onset   Heart disease Mother    Hypertension Mother    COPD Brother    Throat cancer Maternal Uncle    Pancreatic cancer Maternal Uncle    Colon cancer Maternal Uncle     Social History   Socioeconomic History   Marital status: Married    Spouse name: Not on file   Number of children: Not on file   Years of education: Not on file   Highest education level: Not on file  Occupational History   Occupation: [employer] doctor  Tobacco Use   Smoking status: Former  Current packs/day: 0.00    Types: Cigarettes, Cigars    Quit date: 06/17/2001    Years since quitting: 22.0   Smokeless tobacco: Never  Vaping Use   Vaping status: Never Used  Substance and Sexual Activity   Alcohol use: Yes    Comment: social setting only   Drug use: Not Currently   Sexual activity: Yes  Other Topics Concern   Not on file  Social History Narrative   Not on file   Social Drivers of Health   Financial Resource Strain: Not on file  Food Insecurity: Not on file  Transportation Needs: Not on file  Physical  Activity: Not on file  Stress: Not on file  Social Connections: Unknown (05/15/2021)   Received from [facility]   Social Network    Social Network: Not on file  Intimate Partner Violence: Unknown (04/06/2021)   Received from [facility]   HITS    Physically Hurt: Not on file    Insult or Talk Down To: Not on file    Threaten Physical Harm: Not on file    Scream or Curse: Not on file                                                                                                  Objective:  Physical Exam: BP (!) 142/88 (BP Location: Left Arm, Patient Position: Sitting, Cuff Size: Large)   Pulse (!) 56   Temp 97.8 F (36.6 C) (Temporal)   Ht 5' 11 (1.803 m)   Wt 248 lb 3.2 oz (112.6 kg)   SpO2 99%   BMI 34.62 kg/m    Physical Exam GENERAL: Alert, cooperative, well developed, no acute distress. HEENT: Normocephalic, normal oropharynx, moist mucous membranes. CHEST: Clear to auscultation bilaterally, no wheezes, rhonchi, or crackles. CARDIOVASCULAR: Normal heart rate and rhythm, S1 and S2 normal without murmurs. ABDOMEN: Soft, non-tender, non-distended, without organomegaly, normal bowel sounds. EXTREMITIES: No cyanosis or edema. NEUROLOGICAL: Cranial nerves grossly intact, moves all extremities without gross motor or sensory deficit.     No results found.  No results found for this or any previous visit (from the past 2160 hours).      Carnell Christian, MD, MS

## 2023-08-30 ENCOUNTER — Encounter: Admitting: Family Medicine

## 2023-10-03 ENCOUNTER — Encounter: Payer: Self-pay | Admitting: Family Medicine

## 2023-10-03 ENCOUNTER — Ambulatory Visit: Admitting: Family Medicine

## 2023-10-03 ENCOUNTER — Ambulatory Visit: Attending: Family Medicine

## 2023-10-03 VITALS — BP 132/82 | HR 55 | Temp 97.4°F | Resp 18 | Wt 242.0 lb

## 2023-10-03 DIAGNOSIS — I491 Atrial premature depolarization: Secondary | ICD-10-CM

## 2023-10-03 DIAGNOSIS — I119 Hypertensive heart disease without heart failure: Secondary | ICD-10-CM

## 2023-10-03 DIAGNOSIS — R9431 Abnormal electrocardiogram [ECG] [EKG]: Secondary | ICD-10-CM | POA: Diagnosis not present

## 2023-10-03 DIAGNOSIS — I10 Essential (primary) hypertension: Secondary | ICD-10-CM

## 2023-10-03 DIAGNOSIS — Z125 Encounter for screening for malignant neoplasm of prostate: Secondary | ICD-10-CM

## 2023-10-03 DIAGNOSIS — E782 Mixed hyperlipidemia: Secondary | ICD-10-CM

## 2023-10-03 DIAGNOSIS — Z0000 Encounter for general adult medical examination without abnormal findings: Secondary | ICD-10-CM

## 2023-10-03 DIAGNOSIS — R079 Chest pain, unspecified: Secondary | ICD-10-CM | POA: Diagnosis not present

## 2023-10-03 DIAGNOSIS — Z23 Encounter for immunization: Secondary | ICD-10-CM

## 2023-10-03 LAB — CBC WITH DIFFERENTIAL/PLATELET
Basophils Absolute: 0 K/uL (ref 0.0–0.1)
Basophils Relative: 0.6 % (ref 0.0–3.0)
Eosinophils Absolute: 0 K/uL (ref 0.0–0.7)
Eosinophils Relative: 0.6 % (ref 0.0–5.0)
HCT: 44.8 % (ref 39.0–52.0)
Hemoglobin: 15.5 g/dL (ref 13.0–17.0)
Lymphocytes Relative: 33.1 % (ref 12.0–46.0)
Lymphs Abs: 1.8 K/uL (ref 0.7–4.0)
MCHC: 34.6 g/dL (ref 30.0–36.0)
MCV: 91.4 fl (ref 78.0–100.0)
Monocytes Absolute: 0.4 K/uL (ref 0.1–1.0)
Monocytes Relative: 8 % (ref 3.0–12.0)
Neutro Abs: 3.1 K/uL (ref 1.4–7.7)
Neutrophils Relative %: 57.7 % (ref 43.0–77.0)
Platelets: 245 K/uL (ref 150.0–400.0)
RBC: 4.9 Mil/uL (ref 4.22–5.81)
RDW: 13 % (ref 11.5–15.5)
WBC: 5.4 K/uL (ref 4.0–10.5)

## 2023-10-03 LAB — LIPID PANEL
Cholesterol: 191 mg/dL (ref 0–200)
HDL: 47.6 mg/dL (ref >39.00)
LDL Cholesterol: 117 mg/dL — ABNORMAL HIGH (ref 0–99)
NonHDL: 143.84
Total CHOL/HDL Ratio: 4
Triglycerides: 133 mg/dL (ref 0.0–149.0)
VLDL: 26.6 mg/dL (ref 0.0–40.0)

## 2023-10-03 LAB — HEMOGLOBIN A1C: Hgb A1c MFr Bld: 6.2 % (ref 4.6–6.5)

## 2023-10-03 LAB — COMPREHENSIVE METABOLIC PANEL WITH GFR
ALT: 41 U/L (ref 0–53)
AST: 24 U/L (ref 0–37)
Albumin: 4.8 g/dL (ref 3.5–5.2)
Alkaline Phosphatase: 50 U/L (ref 39–117)
BUN: 12 mg/dL (ref 6–23)
CO2: 32 meq/L (ref 19–32)
Calcium: 10.1 mg/dL (ref 8.4–10.5)
Chloride: 100 meq/L (ref 96–112)
Creatinine, Ser: 1.04 mg/dL (ref 0.40–1.50)
GFR: 81.71 mL/min (ref >60.00)
Glucose, Bld: 109 mg/dL — ABNORMAL HIGH (ref 70–99)
Potassium: 3.8 meq/L (ref 3.5–5.1)
Sodium: 141 meq/L (ref 135–145)
Total Bilirubin: 0.7 mg/dL (ref 0.2–1.2)
Total Protein: 7.8 g/dL (ref 6.0–8.3)

## 2023-10-03 LAB — PSA: PSA: 1.39 ng/mL (ref 0.10–4.00)

## 2023-10-03 LAB — MICROALBUMIN / CREATININE URINE RATIO
Creatinine,U: 40.4 mg/dL
Microalb Creat Ratio: UNDETERMINED mg/g (ref 0.0–30.0)
Microalb, Ur: <0.7 mg/dL

## 2023-10-03 LAB — TSH: TSH: 1.11 u[IU]/mL (ref 0.35–5.50)

## 2023-10-03 LAB — HIGH SENSITIVITY CRP: CRP, High Sensitivity: 1.92 mg/L (ref 0.000–5.000)

## 2023-10-03 LAB — BRAIN NATRIURETIC PEPTIDE: Pro B Natriuretic peptide (BNP): 8 pg/mL (ref 0.0–100.0)

## 2023-10-03 MED ORDER — ASPIRIN 81 MG PO TBEC: 81.0000 mg | DELAYED_RELEASE_TABLET | Freq: Every day | ORAL | 3 refills | Status: AC

## 2023-10-03 NOTE — Progress Notes (Unsigned)
 EP to read.

## 2023-10-03 NOTE — Progress Notes (Signed)
 Assessment  Assessment/Plan:  Assessment and Plan Assessment & Plan Adult Wellness Visit Routine annual physical examination. Blood pressure is at goal with current medication regimen. Recent symptoms of chest pressure and fatigue may be related to cardiac issues. - Perform complete metabolic panel and urinalysis with reflex culture and microalbumin creatinine ratio - Order CBC with differential - Order lipid panel - Order PSA for prostate cancer screening - Order thyroid  function tests  Hypertension with left ventricular hypertrophy and cardiac conduction abnormalities Hypertension is well-controlled with current medications. EKG shows sinus bradycardia, occasional PACs, left axis deviation, and voltage criteria for LVH. Nonspecific T wave abnormalities noted. Symptoms of chest pressure and fatigue may be related to cardiac issues. Family history of heart disease noted. Further workup with echocardiogram and cardiology referral recommended due to EKG changes and symptoms. - Continue amlodipine  5 mg daily - Continue losartan -hydrochlorothiazide  100-25 mg daily - Order echocardiogram - Refer to cardiology for further evaluation - Order ambulatory ECG monitoring with Zio patch for 14 days - Order BNP blood test - Start baby aspirin until cardiologist advises otherwise  Obstructive sleep apnea Obstructive sleep apnea with previous CPAP use, discontinued due to discomfort and a deviated septum. Untreated sleep apnea may contribute to cardiac issues.  Hyperlipidemia Hyperlipidemia. Lipid panel to be checked to assess current status. - Order lipid panel  Erectile dysfunction Erectile dysfunction managed with Levitra  (vardenafil ) 20 mg as needed before sexual activity.  General Health Maintenance Routine health maintenance discussed, including screenings and vaccinations. - Administer vaccines as needed     There are no discontinued medications.  Patient Counseling(The following  topics were reviewed and/or handout was given):  -Nutrition: Stressed importance of moderation in sodium/caffeine intake, saturated fat and cholesterol, caloric balance, sufficient intake of fresh fruits, vegetables, and fiber.  -Stressed the importance of regular exercise.   -Substance Abuse: Discussed cessation/primary prevention of tobacco, alcohol, or other drug use; driving or other dangerous activities under the influence; availability of treatment for abuse.   -Injury prevention: Discussed safety belts, safety helmets, smoke detector, smoking near bedding or upholstery.   -Sexuality: Discussed sexually transmitted diseases, partner selection, use of condoms, avoidance of unintended pregnancy and contraceptive alternatives.   -Dental health: Discussed importance of regular tooth brushing, flossing, and dental visits.  -Health maintenance and immunizations reviewed. Please refer to Health maintenance section.  No follow-ups on file.        Subjective:   Encounter date: 10/03/2023  Chief Complaint  Patient presents with   Annual Exam    Pt is fasting today   HM due- vaccinations     Discussed the use of AI scribe software for clinical note transcription with the patient, who gave verbal consent to proceed.  History of Present Illness Elic Derek Davies is a 53 year old male with hypertension and hyperlipidemia who presents for an annual physical and blood pressure recheck.  Chest pain and gastrointestinal symptoms - Experienced a dull chest pain approximately one month ago, described as 'weird like heartburn' with pressure in the chest - Pain was somewhat relieved by burping and exacerbated by spicy food - Did not eat for two days due to discomfort; found some relief with honey - Pain was persistent but subsided after a few days, allowing resumption of eating - Currently has a similar pressure sensation, though less intense, which he attributes to discussing his  symptoms - No current chest pain, shortness of breath, or palpitations  Hypertension management - Hypertension managed with amlodipine  5  mg daily and losartan -hydrochlorothiazide  100-25 mg daily - Present for blood pressure recheck  Hyperlipidemia and cardiovascular risk assessment - Hyperlipidemia under ongoing management - Undergoing routine screening for cardiovascular risk factors, including a lipid panel  Sleep apnea and sleep disturbance - Diagnosed with sleep apnea - Not currently using CPAP due to discomfort and a deviated septum - Snores loudly through the mask and perceives worse sleep quality with CPAP - No surgical intervention for deviated septum to date - Feels tired lately  Erectile dysfunction - Experiences erectile dysfunction - Uses Levitra  (vardenafil ) 20 mg as needed before sexual activity  Respiratory and allergy symptoms - No current cough - Occasional coughing attributed to allergies and working in a dusty environment       10/03/2023    9:12 AM 08/27/2022   11:32 AM 08/27/2022   10:56 AM 10/24/2021    2:26 PM 04/12/2021   11:35 AM  Depression screen PHQ 2/9  Decreased Interest 1 1 0 0 0  Down, Depressed, Hopeless 0 0 0 0 0  PHQ - 2 Score 1 1 0 0 0  Altered sleeping 1 1     Tired, decreased energy 2 1     Change in appetite 0 0     Feeling bad or failure about yourself  0 0     Trouble concentrating 0 0     Moving slowly or fidgety/restless 0 0     Suicidal thoughts 0 0     PHQ-9 Score 4 3     Difficult doing work/chores Somewhat difficult Not difficult at all          10/03/2023    9:13 AM 08/27/2022   11:33 AM 08/27/2022   10:56 AM 05/28/2019    2:04 PM  GAD 7 : Generalized Anxiety Score  Nervous, Anxious, on Edge 0 0 0 0  Control/stop worrying 1 1 0 0  Worry too much - different things 1 1 0 0  Trouble relaxing 1 0 0 0  Restless 0 0 0 0  Easily annoyed or irritable 1 0 0 0  Afraid - awful might happen 0 0 0 0  Total GAD 7 Score 4 2 0 0   Anxiety Difficulty Somewhat difficult Not difficult at all Not difficult at all Not difficult at all    Health Maintenance Due  Topic Date Due   Hepatitis B Vaccines 19-59 Average Risk (1 of 3 - 19+ 3-dose series) Never done   Pneumococcal Vaccine: 50+ Years (1 of 1 - PCV) Never done   Influenza Vaccine  08/02/2023     PMH:  The following were reviewed and entered/updated in epic: Past Medical History:  Diagnosis Date   GERD (gastroesophageal reflux disease)    History of alcohol use disorder 06/18/2023   Hypertension    Insomnia 09/05/2017   Sleep apnea     Patient Active Problem List   Diagnosis Date Noted   Verruca warts (infectious) 06/18/2023   Screening for hepatitis C declined 08/27/2022   Elevated LDL cholesterol level 08/27/2022   Gastroesophageal reflux disease 04/12/2021   Acute non-recurrent frontal sinusitis 04/12/2021   Need for Tdap vaccination 04/12/2021   Heme positive stool 12/16/2020   Need for influenza vaccination 10/20/2020   Congenital cavus foot 10/20/2020   Libido, decreased 11/20/2018   Snores 11/20/2018   Post-nasal drip 11/20/2018   Essential hypertension 09/05/2017   Erectile dysfunction 09/05/2017   Prostate cancer screening 09/05/2017   Suspected sleep apnea 02/26/2017  Left knee pain 12/07/2015   Other fatigue 12/07/2015   Plantar fasciitis, bilateral 07/28/2015   Equinus deformity of foot, acquired 07/28/2015   Need for immunization against influenza 09/14/2014   Obesity (BMI 30.0-34.9) 09/14/2014   Verrucous keratosis 07/02/2013   Hypogonadism male 07/01/2013    Past Surgical History:  Procedure Laterality Date   COLONOSCOPY  2000   HAND SURGERY Left 1992   LIPOMA EXCISION Right 11/08/2020   Procedure: EXCISION LIPOMA RIGHT UPPER QUADRANT OF ABDOMEN;  Surgeon: Sebastian Moles, MD;  Location: [facility] OR;  Service: General;  Laterality: Right;   UMBILICAL HERNIA REPAIR N/A 11/08/2020   Procedure: REPAIR UMBILICAL HERNIA WITH MESH;   Surgeon: Sebastian Moles, MD;  Location: [facility] OR;  Service: General;  Laterality: N/A;    Family History  Problem Relation Age of Onset   Heart disease Mother    Hypertension Mother    COPD Brother    Throat cancer Maternal Uncle    Pancreatic cancer Maternal Uncle    Colon cancer Maternal Uncle     Medications- reviewed and updated Outpatient Medications Prior to Visit  Medication Sig Dispense Refill   amLODipine  (NORVASC ) 5 MG tablet Take 1 tablet (5 mg total) by mouth daily. 90 tablet 3   fluticasone  (FLONASE ) 50 MCG/ACT nasal spray Place 2 sprays into both nostrils daily. (Patient taking differently: Place 2 sprays into both nostrils as needed for allergies or rhinitis.) 16 g 6   ibuprofen (ADVIL) 200 MG tablet Take 400 mg by mouth every 6 (six) hours as needed for moderate pain or headache.     levocetirizine (XYZAL  ALLERGY 24HR) 5 MG tablet Take 1 tablet (5 mg total) by mouth every evening. (Patient taking differently: Take 5 mg by mouth as needed for allergies.) 30 tablet 2   losartan -hydrochlorothiazide  (HYZAAR) 100-25 MG tablet Take 1 tablet by mouth daily. 90 tablet 3   sodium chloride  (OCEAN) 0.65 % SOLN nasal spray Place 2 sprays into both nostrils as needed for congestion. 88 mL 12   vardenafil  (LEVITRA ) 20 MG tablet Take 1 hour before sexual activity 10 tablet 0   No facility-administered medications prior to visit.    No Known Allergies  Social History   Socioeconomic History   Marital status: Married    Spouse name: Not on file   Number of children: Not on file   Years of education: Not on file   Highest education level: Not on file  Occupational History   Occupation: [employer] doctor  Tobacco Use   Smoking status: Former    Current packs/day: 0.00    Types: Cigarettes, Cigars    Quit date: 06/17/2001    Years since quitting: 22.3   Smokeless tobacco: Never  Vaping Use   Vaping status: Never Used  Substance and Sexual Activity   Alcohol use: Yes    Comment:  social setting only   Drug use: Not Currently   Sexual activity: Yes  Other Topics Concern   Not on file  Social History Narrative   Not on file   Social Drivers of Health   Financial Resource Strain: Not on file  Food Insecurity: Not on file  Transportation Needs: Not on file  Physical Activity: Not on file  Stress: Not on file  Social Connections: Unknown (05/15/2021)   Received from [facility]   Social Network    Social Network: Not on file           Objective:  Physical Exam: BP 132/82 (BP Location: Left  Arm, Patient Position: Sitting, Cuff Size: Large)   Pulse (!) 55   Temp (!) 97.4 F (36.3 C) (Temporal)   Resp 18   Wt 242 lb (109.8 kg)   SpO2 98%   BMI 33.75 kg/m   Body mass index is 33.75 kg/m. Wt Readings from Last 3 Encounters:  10/03/23 242 lb (109.8 kg)  06/18/23 248 lb 3.2 oz (112.6 kg)  08/27/22 244 lb 12.8 oz (111 kg)    Physical Exam VITALS: P- 50, BP- 132/82 GENERAL: Alert, cooperative, well developed, no acute distress. HEENT: Normocephalic, normal oropharynx, moist mucous membranes. CHEST: Clear to auscultation bilaterally, no wheezes, rhonchi, or crackles. CARDIOVASCULAR: Bradycardia with a heart murmur auscultated, S1 and S2 normal. ABDOMEN: Soft, non-tender, non-distended, without organomegaly, normal bowel sounds. EXTREMITIES: No cyanosis, edema, or leg swelling. NEUROLOGICAL: Cranial nerves grossly intact, moves all extremities without gross motor or sensory deficit.  Physical Exam      Prior labs:   No results found for this or any previous visit (from the past 2160 hours).  Lab Results  Component Value Date   CHOL 188 08/27/2022   CHOL 169 07/06/2021   CHOL 170 09/11/2017   Lab Results  Component Value Date   HDL 39.00 (L) 08/27/2022   HDL 38.30 (L) 07/06/2021   HDL 44.60 09/11/2017   Lab Results  Component Value Date   LDLCALC 116 (H) 08/27/2022   LDLCALC 113 (H) 07/06/2021   LDLCALC 100 (H) 09/11/2017   Lab  Results  Component Value Date   TRIG 164.0 (H) 08/27/2022   TRIG 90.0 07/06/2021   TRIG 126.0 09/11/2017   Lab Results  Component Value Date   CHOLHDL 5 08/27/2022   CHOLHDL 4 07/06/2021   CHOLHDL 4 09/11/2017   No results found for: LDLDIRECT  Last metabolic panel Lab Results  Component Value Date   GLUCOSE 102 (H) 08/27/2022   NA 142 08/27/2022   K 3.5 08/27/2022   CL 101 08/27/2022   CO2 31 08/27/2022   BUN 11 08/27/2022   CREATININE 1.07 08/27/2022   GFR 79.59 08/27/2022   CALCIUM 10.0 08/27/2022   PROT 7.5 08/27/2022   ALBUMIN 4.7 08/27/2022   BILITOT 0.8 08/27/2022   ALKPHOS 54 08/27/2022   AST 24 08/27/2022   ALT 36 08/27/2022   ANIONGAP 8 11/02/2020    Lab Results  Component Value Date   HGBA1C 5.6 07/06/2021    Last CBC Lab Results  Component Value Date   WBC 6.1 08/27/2022   HGB 15.8 08/27/2022   HCT 46.0 08/27/2022   MCV 92.8 08/27/2022   MCH 32.1 11/02/2020   RDW 13.1 08/27/2022   PLT 235.0 08/27/2022    No results found for: TSH  Lab Results  Component Value Date   PSA 1.20 08/27/2022   PSA 1.38 07/06/2021    Last vitamin D No results found for: MARIEN BOLLS, VD25OH  Lab Results  Component Value Date   BILIRUBINUR NEGATIVE 08/27/2022   UROBILINOGEN 0.2 08/27/2022   LEUKOCYTESUR NEGATIVE 08/27/2022    No results found for: LABMICR, MICROALBUR   At today's visit, we discussed treatment options, associated risk and benefits, and engage in counseling as needed.  Additionally the following were reviewed: Past medical records, past medical and surgical history, family and social background, as well as relevant laboratory results, imaging findings, and specialty notes, where applicable.  This message was generated using dictation software, and as a result, it may contain unintentional typos or errors.  Nevertheless, extensive effort  was made to accurately convey at the pertinent aspects of the patient visit.     There may have been are other unrelated non-urgent complaints, but due to the busy schedule and the amount of time already spent with him, time does not permit to address these issues at today's visit. Another appointment may have or has been requested to review these additional issues.     Arvella Hummer, MD, MS

## 2023-10-05 LAB — APO A1 + B + RATIO
Apolipo. B/A-1 Ratio: 0.7 ratio (ref 0.0–0.7)
Apolipoprotein A-1: 142 mg/dL (ref 101–178)
Apolipoprotein B: 101 mg/dL — ABNORMAL HIGH (ref <90)

## 2023-10-07 ENCOUNTER — Ambulatory Visit: Payer: Self-pay | Admitting: Family Medicine

## 2023-10-07 DIAGNOSIS — E782 Mixed hyperlipidemia: Secondary | ICD-10-CM

## 2023-10-07 LAB — URINALYSIS W MICROSCOPIC + REFLEX CULTURE

## 2023-10-07 MED ORDER — ROSUVASTATIN CALCIUM 10 MG PO TABS: 10.0000 mg | ORAL_TABLET | Freq: Every day | ORAL | 3 refills | Status: AC

## 2023-10-31 ENCOUNTER — Telehealth: Payer: Self-pay | Admitting: Cardiovascular Disease

## 2023-10-31 DIAGNOSIS — I491 Atrial premature depolarization: Secondary | ICD-10-CM | POA: Diagnosis not present

## 2023-10-31 DIAGNOSIS — R9431 Abnormal electrocardiogram [ECG] [EKG]: Secondary | ICD-10-CM

## 2023-10-31 DIAGNOSIS — R079 Chest pain, unspecified: Secondary | ICD-10-CM | POA: Diagnosis not present

## 2023-10-31 DIAGNOSIS — E782 Mixed hyperlipidemia: Secondary | ICD-10-CM

## 2023-10-31 DIAGNOSIS — I119 Hypertensive heart disease without heart failure: Secondary | ICD-10-CM | POA: Diagnosis not present

## 2023-10-31 NOTE — Telephone Encounter (Signed)
 Heart Monitor Results  Most heartbeats were normal. One short slow heartbeat happened during reported chest pain chest pain. Two brief fast beats also occurred. These are not dangerous now, but we need to check your heart's electrical system. Keep the echocardiogram appointment on 11/14 and follow up with cardiology visit on 11/19. Call 911 if you faint, have chest pain that doesn't go away, or trouble breathing.

## 2023-10-31 NOTE — Telephone Encounter (Signed)
Calling with abnormal results. Please advise  

## 2023-10-31 NOTE — Telephone Encounter (Signed)
   Cardiac Monitor Alert  Date of alert: I-Rhythm contacted Triage on: 10/31/2023 at 8:23 am The Alert occurred on: 10/17/2023 at 2:23 am  Patient Name: Derek Davies  DOB: 05/20/1970  MRN: [id number]   [facility] Cardiologist: New Patient: Has OV with Dr. DOROTHA Lesches on 11/20/2023 [facility] EP:  None    Monitor Information: Long Term Monitor [ZioXT] 14 Day to be read by EP Reason:  EKG changes, chest pressure and fatigue Ordering provider:  Beverley Hummer, MD (PCP)   Alert Bradycardia - slowest HR: 38bpm lasting 30 seconds This is the 1st alert for this rhythm. Full report on Zio website; this alert can be found on Page 13, Strip #6 IRhythm rep states patient did report Chest Pain during this episode.   Next Cardiology Appointment   Date:  11/20/2023  Provider:  Lesches MD  The patient was contacted today to notify him of the Echo appt that was moved up to 11/15/2023; he will then see Dr. Lesches on 11/20/23. NO concerns on behalf of pt; he states he has been feeling well and his wife is a engineer, civil (consulting). Advised him that his PCP will give him results of heart monitor.     Plan:  Will forward to EP provider. Full report seen under CV Proc tab.   Zara Squires [place], [place]  10/31/2023 8:27 AM

## 2023-10-31 NOTE — Telephone Encounter (Addendum)
 Forwarded to ordering provider Beverley Sebastian COME.

## 2023-11-04 NOTE — Telephone Encounter (Signed)
 Pt was notified of results over the phone on Friday 11/01/2023. Documented in result note sent by Dr Sebastian.

## 2023-11-13 ENCOUNTER — Ambulatory Visit (HOSPITAL_COMMUNITY)
Admission: RE | Admit: 2023-11-13 | Discharge: 2023-11-13 | Disposition: A | Discharge status: Home / Self Care | Source: Ambulatory Visit | Attending: Internal Medicine | Admitting: Internal Medicine

## 2023-11-13 DIAGNOSIS — Z0000 Encounter for general adult medical examination without abnormal findings: Secondary | ICD-10-CM

## 2023-11-13 DIAGNOSIS — I10 Essential (primary) hypertension: Secondary | ICD-10-CM | POA: Diagnosis not present

## 2023-11-13 LAB — ECHOCARDIOGRAM COMPLETE
AR max vel: 3.01 cm2
AV Area VTI: 3.08 cm2
AV Area mean vel: 3.13 cm2
AV Mean grad: 7 mmHg
AV Peak grad: 13.1 mmHg
Ao pk vel: 1.81 m/s
Area-P 1/2: 4.31 cm2
S' Lateral: 3.4 cm

## 2023-11-20 ENCOUNTER — Encounter: Payer: Self-pay | Admitting: Cardiovascular Disease

## 2023-11-20 ENCOUNTER — Ambulatory Visit: Attending: Cardiovascular Disease | Admitting: Cardiovascular Disease

## 2023-11-20 ENCOUNTER — Ambulatory Visit: Admitting: Cardiovascular Disease

## 2023-11-20 VITALS — BP 138/80 | HR 48 | Ht 71.0 in | Wt 245.8 lb

## 2023-11-20 DIAGNOSIS — E7800 Pure hypercholesterolemia, unspecified: Secondary | ICD-10-CM

## 2023-11-20 DIAGNOSIS — R29818 Other symptoms and signs involving the nervous system: Secondary | ICD-10-CM | POA: Diagnosis not present

## 2023-11-20 DIAGNOSIS — I10 Essential (primary) hypertension: Secondary | ICD-10-CM

## 2023-11-20 DIAGNOSIS — R001 Bradycardia, unspecified: Secondary | ICD-10-CM | POA: Insufficient documentation

## 2023-11-20 DIAGNOSIS — R9431 Abnormal electrocardiogram [ECG] [EKG]: Secondary | ICD-10-CM | POA: Diagnosis not present

## 2023-11-20 NOTE — Assessment & Plan Note (Signed)
 History of hyperlipidemia with recent lipid profile performed 10/03/2023 revealing total cholesterol 191, LDL 117 and HDL of 47.  He was recently started on rosuvastatin  10 mg a day by his PCP.  I am going to get a coronary calcium  score to restratify.

## 2023-11-20 NOTE — Patient Instructions (Signed)
 Medication Instructions:  Your physician recommends that you continue on your current medications as directed. Please refer to the Current Medication list given to you today.  *If you need a refill on your cardiac medications before your next appointment, please call your pharmacy*  Testing/Procedures: Dr. Court has ordered a CT coronary calcium score.   Test locations:  [facility] at [facility] [place]  [facility] at [facility]  This is $99 out of pocket.   Coronary CalciumScan A coronary calcium scan is an imaging test used to look for deposits of calcium and other fatty materials (plaques) in the inner lining of the blood vessels of the heart (coronary arteries). These deposits of calcium and plaques can partly clog and narrow the coronary arteries without producing any symptoms or warning signs. This puts a person at risk for a heart attack. This test can detect these deposits before symptoms develop. Tell a health care provider about: Any allergies you have. All medicines you are taking, including vitamins, herbs, eye drops, creams, and over-the-counter medicines. Any problems you or family members have had with anesthetic medicines. Any blood disorders you have. Any surgeries you have had. Any medical conditions you have. Whether you are pregnant or may be pregnant. What are the risks? Generally, this is a safe procedure. However, problems may occur, including: Harm to a pregnant woman and her unborn baby. This test involves the use of radiation. Radiation exposure can be dangerous to a pregnant woman and her unborn baby. If you are pregnant, you generally should not have this procedure done. Slight increase in the risk of cancer. This is because of the radiation involved in the test. What happens before the procedure? No preparation is needed for this procedure. What happens  during the procedure? You will undress and remove any jewelry around your neck or chest. You will put on a hospital gown. Sticky electrodes will be placed on your chest. The electrodes will be connected to an electrocardiogram (ECG) machine to record a tracing of the electrical activity of your heart. A CT scanner will take pictures of your heart. During this time, you will be asked to lie still and hold your breath for 2-3 seconds while a picture of your heart is being taken. The procedure may vary among health care providers and hospitals. What happens after the procedure? You can get dressed. You can return to your normal activities. It is up to you to get the results of your test. Ask your health care provider, or the department that is doing the test, when your results will be ready. Summary A coronary calcium scan is an imaging test used to look for deposits of calcium and other fatty materials (plaques) in the inner lining of the blood vessels of the heart (coronary arteries). Generally, this is a safe procedure. Tell your health care provider if you are pregnant or may be pregnant. No preparation is needed for this procedure. A CT scanner will take pictures of your heart. You can return to your normal activities after the scan is done. This information is not intended to replace advice given to you by your health care provider. Make sure you discuss any questions you have with your health care provider. Document Released: 06/16/2007 Document Revised: 11/07/2015 Document Reviewed: 11/07/2015 Elsevier Interactive Patient Education  2017 [employer].   Follow-Up: At [facility], you and your health needs are our priority.  As  part of our continuing mission to provide you with exceptional heart care, our providers are all part of one team.  This team includes your primary Cardiologist (physician) and Advanced Practice Providers or APPs (Physician Assistants and Nurse  Practitioners) who all work together to provide you with the care you need, when you need it.  Your next appointment:   6 month(s)  Provider:   Dorn Lesches, MD    We recommend signing up for the patient portal called MyChart.  Sign up information is provided on this After Visit Summary.  MyChart is used to connect with patients for Virtual Visits (Telemedicine).  Patients are able to view lab/test results, encounter notes, upcoming appointments, etc.  Non-urgent messages can be sent to your provider as well.   To learn more about what you can do with MyChart, go to [email or web address].

## 2023-11-20 NOTE — Assessment & Plan Note (Signed)
 History of essential hypertension blood pressure measured today at 138/80.  He is on losartan /hydrochlorothiazide , and amlodipine .

## 2023-11-20 NOTE — Assessment & Plan Note (Signed)
 Heart rate today is 48.  He is in sinus rhythm.  He has worn a monitor that showed an average heart rate of 56 over 13 days, 2 short runs of nonsustained SVT and occasional supraventricular ectopy.  His TSH is normal.  He is not symptomatic when he is bradycardic.

## 2023-11-20 NOTE — Progress Notes (Signed)
 11/20/2023 Derek Davies   03-28-1970  [id number]  Primary Physician Sebastian Beverley NOVAK, MD Primary Cardiologist: Dorn JINNY Lesches MD [place] [place], [place]  HPI:  Derek Davies is a 53 y.o. moderately overweight married Caucasian male father of 4 children (2 biologic) with no grandchildren who is accompanied by his wife Derek Davies who is an [employer] and was a cardiology nurse on [employer] and now is a case production designer, theatre/television/film.  He was referred by his primary care provider because of an abnormal EKG.  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  Respecters include 20 pack years of tobacco use having quit 10 years ago.  He does still vape.  He has a history of treated hypertension and hyperlipidemia.  There is no family history of heart disease.  Never had heart attack or stroke.  Denies chest pain or shortness of breath.  Does not specifically exercise but does do yard work and walks at work without symptoms.  He does have obstructive sleep apnea although he could not tolerate CPAP.  He has an abnormal EKG notable for changes consistent with LVH although 2D echo performed 11/13/2023 showed no evidence of this or otherwise he had normal LV function and normal valvular function.  He also has sinus bradycardia and had an event monitor that he wore for 2 weeks revealing average heart rate of 56, minimum of 33 and maximum 150 with occasional supraventricular ectopy and short runs of nonsustained supraventricular tachycardia.   Current Meds  Medication Sig   amLODipine  (NORVASC ) 5 MG tablet Take 1 tablet (5 mg total) by mouth daily.   aspirin EC 81 MG tablet Take 1 tablet (81 mg total) by mouth daily. Swallow whole.   fluticasone  (FLONASE ) 50 MCG/ACT nasal spray Place 2 sprays into both nostrils daily. (Patient taking differently: Place 2 sprays into both nostrils as needed for allergies or rhinitis.)   ibuprofen (ADVIL) 200 MG tablet Take 400 mg by mouth every 6 (six) hours as needed for moderate  pain or headache.   levocetirizine (XYZAL  ALLERGY 24HR) 5 MG tablet Take 1 tablet (5 mg total) by mouth every evening. (Patient taking differently: Take 5 mg by mouth as needed for allergies.)   losartan -hydrochlorothiazide  (HYZAAR) 100-25 MG tablet Take 1 tablet by mouth daily.   rosuvastatin (CRESTOR) 10 MG tablet Take 1 tablet (10 mg total) by mouth daily.   sodium chloride  (OCEAN) 0.65 % SOLN nasal spray Place 2 sprays into both nostrils as needed for congestion.     No Known Allergies  Social History   Socioeconomic History   Marital status: Married    Spouse name: Not on file   Number of children: Not on file   Years of education: Not on file   Highest education level: Not on file  Occupational History   Occupation: Truck Hospital Doctor  Tobacco Use   Smoking status: Former    Current packs/day: 0.00    Types: Cigarettes, Cigars    Quit date: 06/17/2001    Years since quitting: 22.4   Smokeless tobacco: Never  Vaping Use   Vaping status: Never Used  Substance and Sexual Activity   Alcohol use: Yes    Comment: social setting only   Drug use: Not Currently   Sexual activity: Yes  Other Topics Concern   Not on file  Social History Narrative   Not on file   Social Drivers of Health   Financial Resource Strain: Not on file  Food  Insecurity: Not on file  Transportation Needs: Not on file  Physical Activity: Not on file  Stress: Not on file  Social Connections: Not on file  Intimate Partner Violence: Not on file     Review of Systems: General: negative for chills, fever, night sweats or weight changes.  Cardiovascular: negative for chest pain, dyspnea on exertion, edema, orthopnea, palpitations, paroxysmal nocturnal dyspnea or shortness of breath Dermatological: negative for rash Respiratory: negative for cough or wheezing Urologic: negative for hematuria Abdominal: negative for nausea, vomiting, diarrhea, bright red blood per rectum, melena, or hematemesis Neurologic:  negative for visual changes, syncope, or dizziness All other systems reviewed and are otherwise negative except as noted above.    Blood pressure 138/80, pulse (!) 48, height 5' 11 (1.803 m), weight 245 lb 12.8 oz (111.5 kg), SpO2 98%.  General appearance: alert and no distress Neck: no adenopathy, no carotid bruit, no JVD, supple, symmetrical, trachea midline, and thyroid  not enlarged, symmetric, no tenderness/mass/nodules Lungs: clear to auscultation bilaterally Heart: regular rate and rhythm, S1, S2 normal, no murmur, click, rub or gallop Extremities: extremities normal, atraumatic, no cyanosis or edema Pulses: 2+ and symmetric Skin: Skin color, texture, turgor normal. No rashes or lesions Neurologic: Grossly normal  EKG not performed today      ASSESSMENT AND PLAN:   Essential hypertension History of essential hypertension blood pressure measured today at 138/80.  He is on losartan /hydrochlorothiazide , and amlodipine .  Elevated LDL cholesterol level History of hyperlipidemia with recent lipid profile performed 10/03/2023 revealing total cholesterol 191, LDL 117 and HDL of 47.  He was recently started on rosuvastatin 10 mg a day by his PCP.  I am going to get a coronary calcium score to restratify.  Suspected sleep apnea History of of obstructive sleep apnea intolerant to CPAP.  He apparently has a deviated septum.  I am referring him to Dr. Shlomo to discuss the inspire device as an option.  Nonspecific abnormal electrocardiogram (ECG) (EKG) EKG shows LVH although 2D echo does not confirm this.  Sinus bradycardia Heart rate today is 48.  He is in sinus rhythm.  He has worn a monitor that showed an average heart rate of 56 over 13 days, 2 short runs of nonsustained SVT and occasional supraventricular ectopy.  His TSH is normal.  He is not symptomatic when he is bradycardic.     Dorn DOROTHA Lesches MD FACP,FACC,FAHA, [facility] 11/20/2023 10:25 AM

## 2023-11-20 NOTE — Assessment & Plan Note (Signed)
 EKG shows LVH although 2D echo does not confirm this.

## 2023-11-20 NOTE — Assessment & Plan Note (Signed)
 History of of obstructive sleep apnea intolerant to CPAP.  He apparently has a deviated septum.  I am referring him to Dr. Shlomo to discuss the inspire device as an option.

## 2023-11-27 ENCOUNTER — Ambulatory Visit (HOSPITAL_COMMUNITY)
Admission: RE | Admit: 2023-11-27 | Discharge: 2023-11-27 | Disposition: A | Discharge status: Home / Self Care | Payer: Self-pay | Source: Ambulatory Visit | Attending: Cardiology | Admitting: Cardiology

## 2023-11-27 DIAGNOSIS — E7800 Pure hypercholesterolemia, unspecified: Secondary | ICD-10-CM | POA: Insufficient documentation

## 2023-11-29 ENCOUNTER — Ambulatory Visit: Payer: Self-pay | Admitting: Cardiovascular Disease

## 2023-12-05 ENCOUNTER — Ambulatory Visit (HOSPITAL_COMMUNITY)

## 2024-01-22 ENCOUNTER — Encounter: Payer: Self-pay | Admitting: Cardiology

## 2024-01-22 ENCOUNTER — Ambulatory Visit: Admitting: Cardiology

## 2024-01-22 VITALS — BP 136/96 | HR 50 | Ht 71.0 in | Wt 256.6 lb

## 2024-01-22 DIAGNOSIS — G4733 Obstructive sleep apnea (adult) (pediatric): Secondary | ICD-10-CM

## 2024-01-22 DIAGNOSIS — I10 Essential (primary) hypertension: Secondary | ICD-10-CM

## 2024-01-22 NOTE — Patient Instructions (Signed)
 Medication Instructions:  Your physician recommends that you continue on your current medications as directed. Please refer to the Current Medication list given to you today.  *If you need a refill on your cardiac medications before your next appointment, please call your pharmacy*  Lab Work: NONE    Testing/Procedures: Your physician has referred you to Dr. Carlie ENT for Sleep Apnea.   Follow-Up:As needed At [facility], you and your health needs are our priority.  As part of our continuing mission to provide you with exceptional heart care, our providers are all part of one team.  This team includes your primary Cardiologist (physician) and Advanced Practice Providers or APPs (Physician Assistants and Nurse Practitioners) who all work together to provide you with the care you need, when you need it.    Provider:   Wilbert Bihari, MD      Other Instructions

## 2024-01-22 NOTE — Progress Notes (Signed)
 "    Sleep Medicine CONSULT Note    Date:  01/22/2024   ID:  Derek Davies, DOB 1970/08/09, MRN [id number]  PCP:  Derek Beverley NOVAK, MD  Cardiologist: Derek Lesches, MD   Chief Complaint  Patient presents with   New Patient (Initial Visit)    Obstructive sleep    History of Present Illness:  Derek Davies is a 54 y.o. male who is being seen today for the evaluation of obstructive sleep apnea at the request of Derek Lesches, MD.  This is a 54 year old male with a history of GERD, hypertension, insomnia and obstructive sleep apnea.  He tried CPAP but unfortunately was intolerant.  He does have a known deviated nasal septum.  His initial sleep study was 04/17/2019 which was a home sleep study and demonstrated moderate obstructive sleep apnea with an AHI of 16.5/h .  He tried CPAP with a FFM and then nasal pillow mask.  He tried the PAP for a couple months and would wake up gasping for breath.  He went to a FFM and was snoring loudly through the mask.  It was recommended that he consider getting his deviated nasal septum fixed but never did.  He felt the pressure was way too high. He eventually stopped using CPAP.   He does not feel rested when he gets up in the am.  He wakes up at 2-3am wide awake and sometimes takes several hours to get back to sleep. This occurs through the week.  Sometimes he will get sleepy during the day.  He gets drowsy right after lunch.  Occasionally he will nap if he slept poorly the night before.He occasionally has AM HAs.  His wife says that he snores loudly and sometimes will stop breathing in his sleep.  Sometimes will wake himself up snoring.   Past Medical History:  Diagnosis Date   GERD (gastroesophageal reflux disease)    History of alcohol use disorder 06/18/2023   Hypertension    Insomnia 09/05/2017   Sleep apnea     Past Surgical History:  Procedure Laterality Date   COLONOSCOPY  2000   HAND SURGERY Left 1992   LIPOMA EXCISION Right 11/08/2020    Procedure: EXCISION LIPOMA RIGHT UPPER QUADRANT OF ABDOMEN;  Surgeon: Derek Moles, MD;  Location: [facility] OR;  Service: General;  Laterality: Right;   UMBILICAL HERNIA REPAIR N/A 11/08/2020   Procedure: REPAIR UMBILICAL HERNIA WITH MESH;  Surgeon: Derek Moles, MD;  Location: [facility];  Service: General;  Laterality: N/A;    Current Medications: Active Medications[1]  Allergies:   Patient has no known allergies.   Social History   Socioeconomic History   Marital status: Married    Spouse name: Not on file   Number of children: Not on file   Years of education: Not on file   Highest education level: Not on file  Occupational History   Occupation: Truck Hospital Doctor  Tobacco Use   Smoking status: Former    Current packs/day: 0.00    Types: Cigarettes, Cigars    Quit date: 06/17/2001    Years since quitting: 22.6   Smokeless tobacco: Never  Vaping Use   Vaping status: Never Used  Substance and Sexual Activity   Alcohol use: Yes    Comment: social setting only   Drug use: Not Currently   Sexual activity: Yes  Other Topics Concern   Not on file  Social History Narrative   Not on file   Social Drivers of Health  Tobacco Use: Medium Risk (01/22/2024)   Patient History    Smoking Tobacco Use: Former    Smokeless Tobacco Use: Never    Passive Exposure: Not on Actuary Strain: Not on file  Food Insecurity: Not on file  Transportation Needs: Not on file  Physical Activity: Not on file  Stress: Not on file  Social Connections: Not on file  Depression (PHQ2-9): Low Risk (10/03/2023)   Depression (PHQ2-9)    PHQ-2 Score: 4  Alcohol Screen: Not on file  Housing: Not on file  Utilities: Not on file  Health Literacy: Not on file     Family History:  The patient's family history includes COPD in his brother; Colon cancer in his maternal uncle; Heart disease in his mother; Hypertension in his mother; Pancreatic cancer in his maternal uncle; Throat cancer in his  maternal uncle.   ROS:   Please see the history of present illness.    ROS All other systems reviewed and are negative.      No data to display             PHYSICAL EXAM:   VS:  BP (!) 136/96   Pulse (!) 50   Ht 5' 11 (1.803 m)   Wt 256 lb 9.6 oz (116.4 kg)   SpO2 97%   BMI 35.79 kg/m    GEN: Well nourished, well developed, in no acute distress  HEENT: normal  Neck: no JVD, carotid bruits, or masses Cardiac: RRR; no murmurs, rubs, or gallops,no edema.  Intact distal pulses bilaterally.  Respiratory:  clear to auscultation bilaterally, normal work of breathing GI: soft, nontender, nondistended, + BS MS: no deformity or atrophy  Skin: warm and dry, no rash Neuro:  Alert and Oriented x 3, Strength and sensation are intact Psych: euthymic mood, full affect  Wt Readings from Last 3 Encounters:  01/22/24 256 lb 9.6 oz (116.4 kg)  11/20/23 245 lb 12.8 oz (111.5 kg)  10/03/23 242 lb (109.8 kg)      Studies/Labs Reviewed:   Home sleep study 2021  Recent Labs: 10/03/2023: ALT 41; BUN 12; Creatinine, Ser 1.04; Hemoglobin 15.5; Platelets 245.0; Potassium 3.8; Pro B Natriuretic peptide (BNP) 8.0; Sodium 141; TSH 1.11     ASSESSMENT:    1. OSA (obstructive sleep apnea)   2. Essential hypertension      PLAN:  In order of problems listed above:  OSA  - Patient has a history of obstructive moderate obstructive sleep apnea diagnosed in 2021 with an AHI of 16.5/h - Had tried CPAP but was intolerant - BMI is 34.28 - He is interested in the inspire device which we discussed today. - Based on weight his BMI is right at the cutoff so he would likely be a candidate -he does have a deviated septum that he has not had fixed.   -we had decided to refer to Dr. Carlie with ENT for evaluation of nasal septum.  If it is bad enough and he decides to get a septoplasty then we would wait until he is healed and get a HST to see if he has any residual apnea and further treatment with  Elizabeth would be made based on findings of sleep study  Hypertension - BP borderline controlled on exam today - Continue amlodipine  5 mg daily, Hyzaar 100-25 mg daily with as needed refills  Time Spent: 20 minutes total time of encounter, including 15 minutes spent in face-to-face patient care on the date of this encounter.  This time includes coordination of care and counseling regarding above mentioned problem list. Remainder of non-face-to-face time involved reviewing chart documents/testing relevant to the patient encounter and documentation in the medical record. I have independently reviewed documentation from referring provider  Medication Adjustments/Labs and Tests Ordered: Current medicines are reviewed at length with the patient today.  Concerns regarding medicines are outlined above.  Medication changes, Labs and Tests ordered today are listed in the Patient Instructions below.  There are no Patient Instructions on file for this visit.  FOllowup with me PRN   Signed, Wilbert Bihari, MD  01/22/2024 9:26 AM    [facility] [address] [place], [place], [place]  [address] Phone: [phone]; Fax: [phone]      [1]  Current Meds  Medication Sig   amLODipine  (NORVASC ) 5 MG tablet Take 1 tablet (5 mg total) by mouth daily.   aspirin  EC 81 MG tablet Take 1 tablet (81 mg total) by mouth daily. Swallow whole.   fluticasone  (FLONASE ) 50 MCG/ACT nasal spray Place 2 sprays into both nostrils daily. (Patient taking differently: Place 2 sprays into both nostrils as needed for allergies or rhinitis.)   ibuprofen (ADVIL) 200 MG tablet Take 400 mg by mouth every 6 (six) hours as needed for moderate pain or headache.   levocetirizine (XYZAL  ALLERGY 24HR) 5 MG tablet Take 1 tablet (5 mg total) by mouth every evening. (Patient taking differently: Take 5 mg by mouth as needed for allergies.)   losartan -hydrochlorothiazide  (HYZAAR) 100-25 MG tablet Take 1 tablet by mouth  daily.   rosuvastatin  (CRESTOR ) 10 MG tablet Take 1 tablet (10 mg total) by mouth daily.   sodium chloride  (OCEAN) 0.65 % SOLN nasal spray Place 2 sprays into both nostrils as needed for congestion.   "

## 2024-02-07 ENCOUNTER — Telehealth: Payer: Self-pay

## 2024-02-07 NOTE — Telephone Encounter (Signed)
 Contacted patient to inform him that I contacted his pharmacy and he still has refills on his script. LVM for patient to call pharmacy

## 2024-02-07 NOTE — Telephone Encounter (Signed)
 Copied from CRM [id number]. Topic: Clinical - Medication Refill >> Feb 07, 2024 12:48 PM China J wrote: Medication: rosuvastatin  (CRESTOR ) 10 MG tablet  Has the patient contacted their pharmacy? No (Agent: If no, request that the patient contact the pharmacy for the refill. If patient does not wish to contact the pharmacy document the reason why and proceed with request.) (Agent: If yes, when and what did the pharmacy advise?) Patient never had medication sent to this pharmacy before.  This is the patient's preferred pharmacy:  [facility] [address], [place] - [address] [place] [address] Phone: [phone] Fax: [phone]  Is this the correct pharmacy for this prescription? Yes If no, delete pharmacy and type the correct one.   Has the prescription been filled recently? No  Is the patient out of the medication? Yes  Has the patient been seen for an appointment in the last year OR does the patient have an upcoming appointment? Yes  Can we respond through MyChart? Yes  Agent: Please be advised that Rx refills may take up to 3 business days. We ask that you follow-up with your pharmacy.
